# Patient Record
Sex: Male | Born: 1980 | Marital: Single | Smoking: Never smoker
Health system: Southern US, Community
[De-identification: ages and names within clinical notes are randomized; demographics above are authoritative.]

## PROBLEM LIST (undated history)

## (undated) DIAGNOSIS — L409 Psoriasis, unspecified: Secondary | ICD-10-CM

## (undated) DIAGNOSIS — R011 Cardiac murmur, unspecified: Secondary | ICD-10-CM

## (undated) HISTORY — DX: Psoriasis, unspecified: L40.9

## (undated) HISTORY — DX: Cardiac murmur, unspecified: R01.1

---
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
## 2005-07-10 ENCOUNTER — Ambulatory Visit: Payer: Self-pay | Admitting: Internal Medicine

## 2005-07-17 ENCOUNTER — Ambulatory Visit: Payer: Self-pay | Admitting: Internal Medicine

## 2005-11-14 ENCOUNTER — Ambulatory Visit: Payer: Self-pay | Admitting: Internal Medicine

## 2007-09-29 ENCOUNTER — Ambulatory Visit: Payer: Self-pay | Admitting: Internal Medicine

## 2007-09-29 DIAGNOSIS — R0789 Other chest pain: Secondary | ICD-10-CM | POA: Insufficient documentation

## 2010-05-26 NOTE — Assessment & Plan Note (Signed)
[facility]                             PRIMARY CARE OFFICE NOTE   NAME:Robert Hutchinson, Robert Hutchinson                      MRN:          [id number]  DATE:07/17/2005                            DOB:          1980-11-12    This patient is a 30 year old Caucasian male who presents today to establish  with our practice.  He has enjoyed excellent health.  At approximate age  62, he was hospitalized for a febrile dermatitis-type syndrome.  He knows  very few details but was released after a few days.  He was hospitalized in  1998 for an appendectomy.  He has history of mild psoriasis, otherwise no  medical illnesses.   REVIEW OF SYSTEMS:  Negative.  He has had a history of a functional murmur  that has been evaluated in the past.   SOCIAL HISTORY:  He is married.  Went to school in [place] state.  Relocated from the [place] area.  He is married, no children.  Nonsmoker.   FAMILY HISTORY:  Father and mother both age 62.  Father apparently has  history of a benign brain tumor, probably a meningioma.  Paternal  grandmother with colon cancer.   EXAMINATION:  Revealed a healthy-appearing fit male in no acute distress.  Blood pressure was low normal.  SKIN:  Negative without any psoriatic  lesions at the present time.  FUNDI, EARS, NOSE AND THROAT:  Clear.  NECK:  No bruits or adenopathy.  CHEST:  Clear.  CARDIOVASCULAR EXAM:  Normal heart  sounds.  There is no audible murmur on today's exam.  ABDOMEN:  Soft,  nontender, no organomegaly.  EXTERNAL GENITALIA:  Normal.  EXTREMITIES:  Revealed no edema.  Peripheral pulses were full.   IMPRESSION:  Psoriasis.  Unremarkable clinical exam.   DISPOSITION:  He was given refills on his topical steroids.  Return here  p.r.n.                                   Gordy Savers, MD   PFK/MedQ  DD:  07/17/2005  DT:  07/17/2005  Job #:  [id number]

## 2010-07-17 ENCOUNTER — Ambulatory Visit (INDEPENDENT_AMBULATORY_CARE_PROVIDER_SITE_OTHER): Payer: BC Managed Care – PPO | Admitting: Internal Medicine

## 2010-07-17 DIAGNOSIS — Z23 Encounter for immunization: Secondary | ICD-10-CM

## 2010-07-17 DIAGNOSIS — Z0000 Encounter for general adult medical examination without abnormal findings: Secondary | ICD-10-CM

## 2010-07-17 MED ORDER — MEASLES, MUMPS & RUBELLA VAC ~~LOC~~ INJ: 0.5000 mL | INJECTION | Freq: Once | SUBCUTANEOUS | Status: DC

## 2010-07-24 ENCOUNTER — Ambulatory Visit (INDEPENDENT_AMBULATORY_CARE_PROVIDER_SITE_OTHER): Payer: BC Managed Care – PPO | Admitting: Internal Medicine

## 2010-07-24 DIAGNOSIS — Z0000 Encounter for general adult medical examination without abnormal findings: Secondary | ICD-10-CM

## 2010-07-24 DIAGNOSIS — Z23 Encounter for immunization: Secondary | ICD-10-CM

## 2011-01-03 ENCOUNTER — Encounter: Payer: Self-pay | Admitting: Family Medicine

## 2011-01-05 ENCOUNTER — Ambulatory Visit (INDEPENDENT_AMBULATORY_CARE_PROVIDER_SITE_OTHER): Payer: BC Managed Care – PPO | Admitting: Family Medicine

## 2011-01-05 ENCOUNTER — Encounter: Payer: Self-pay | Admitting: Family Medicine

## 2011-01-05 VITALS — BP 130/80 | HR 60 | Temp 98.7°F | Wt 227.0 lb

## 2011-01-05 DIAGNOSIS — L408 Other psoriasis: Secondary | ICD-10-CM

## 2011-01-05 DIAGNOSIS — L409 Psoriasis, unspecified: Secondary | ICD-10-CM

## 2011-01-05 DIAGNOSIS — Z23 Encounter for immunization: Secondary | ICD-10-CM

## 2011-01-05 NOTE — Progress Notes (Signed)
Addended by: Luisa Dago on: 01/05/2011 09:16 AM   Modules accepted: Orders

## 2011-01-05 NOTE — Progress Notes (Signed)
Office Note 01/05/2011  CC:  Chief Complaint  Patient presents with  . Establish Care    transfer from [place]    HPI:  Robert Hutchinson is a 30 y.o. White male who is here to establish care---he lives closer to [place] than [place] (used to see Dr. Kirtland Bouchard). Patient's most recent primary MD: Dr Kirtland Bouchard at [facility]. Old records were reviewed prior to or during today's visit.  He has no complaints, just wants to get established with out office.  Last CPE was about 3 yrs ago.  He does not want to get his CPE today.  Past Medical History  Diagnosis Date  . Psoriasis Dxd age 69    Sees derm in [place]: on enbrel since 2010 or so--helping well.  Marland Kitchen Heart murmur     functional    Past Surgical History  Procedure Date  . Appendectomy 1998    Family History  Problem Relation Age of Onset  . Other Father     Meningioma  . Cancer Maternal Grandmother     colon  . Cancer Mother     kidney cancer about age 52    History   Social History  . Marital Status: Married    Spouse Name: Belenda Cruise    Number of Children: N/A  . Years of Education: N/A   Occupational History  . Teacher    Social History Main Topics  . Smoking status: Never Smoker   . Smokeless tobacco: Never Used  . Alcohol Use: Yes     twice a week  . Drug Use: No  . Sexually Active: Not on file   Other Topics Concern  . Not on file   Social History Narrative   Married, moved to [place] in 2005.Has one son 2 and 1/2 y/o.Teacher at [employer].  Went to [employer].No Tob, no signif alc, no drugs.Exercise: cardio and lt wt 3-5 times per week.    Outpatient Encounter Prescriptions as of 01/05/2011  Medication Sig Dispense Refill  . etanercept (ENBREL) 50 MG/ML injection Inject 50 mg into the skin once a week.        . Multiple Vitamin (MULTIVITAMIN) tablet Take 1 tablet by mouth daily.          No Known Allergies  ROS Review of Systems  Constitutional: Negative for fever, chills, appetite  change and fatigue.  HENT: Negative for ear pain, congestion, sore throat, neck stiffness and dental problem.   Eyes: Negative for discharge, redness and visual disturbance.  Respiratory: Negative for cough, chest tightness, shortness of breath and wheezing.   Cardiovascular: Negative for chest pain, palpitations and leg swelling.  Gastrointestinal: Negative for nausea, vomiting, abdominal pain, diarrhea and blood in stool.  Genitourinary: Negative for dysuria, urgency, frequency, hematuria, flank pain and difficulty urinating.  Musculoskeletal: Negative for myalgias, back pain, joint swelling and arthralgias.  Skin: Negative for pallor and rash.  Neurological: Negative for dizziness, speech difficulty, weakness and headaches.  Hematological: Negative for adenopathy. Does not bruise/bleed easily.  Psychiatric/Behavioral: Negative for confusion and sleep disturbance. The patient is not nervous/anxious.     PE; Blood pressure 130/80, pulse 60, temperature 98.7 F (37.1 C), temperature source Temporal, weight 227 lb (102.967 kg), SpO2 99.00%. Gen: Alert, well appearing.  Patient is oriented to person, place, time, and situation. ENT: Ears: EACs clear, normal epithelium.  TMs with good light reflex and landmarks bilaterally.  Eyes: no injection, icteris, swelling, or exudate.  EOMI, PERRLA. Nose: no drainage or turbinate edema/swelling.  No  injection or focal lesion.  Mouth: lips without lesion/swelling.  Oral mucosa pink and moist.  Dentition intact and without obvious caries or gingival swelling.  Oropharynx without erythema, exudate, or swelling.  Neck - No masses or thyromegaly or limitation in range of motion CV: RRR, no m/r/g.   LUNGS: CTA bilat, nonlabored resps, good aeration in all lung fields. EXT: no clubbing, cyanosis, or edema.   Pertinent labs:  none  ASSESSMENT AND PLAN:   Psoriasis He is stable and doing well on Enbrel. He gets routine derm f/u/monitoring for this and will  continue this. He'll set up appt here for CPE sometime in the near future. Flu vaccine given IM today. He is due for 3rd and final TwinRix (HepA and HepB "fast track" immunization) but we don't have this vaccine today. We'll order it and he'll either return at nurse visit and get this or he'll get it at his scheduled CPE.     No Follow-up on file.

## 2011-01-05 NOTE — Assessment & Plan Note (Signed)
He is stable and doing well on Enbrel. He gets routine derm f/u/monitoring for this and will continue this. He'll set up appt here for CPE sometime in the near future. Flu vaccine given IM today. He is due for 3rd and final TwinRix (HepA and HepB "fast track" immunization) but we don't have this vaccine today. We'll order it and he'll either return at nurse visit and get this or he'll get it at his scheduled CPE.

## 2011-01-13 ENCOUNTER — Ambulatory Visit (INDEPENDENT_AMBULATORY_CARE_PROVIDER_SITE_OTHER): Payer: BC Managed Care – PPO | Admitting: Family Medicine

## 2011-01-13 ENCOUNTER — Encounter: Payer: Self-pay | Admitting: Family Medicine

## 2011-01-13 VITALS — BP 136/86 | HR 62 | Temp 98.0°F | Ht 73.0 in | Wt 230.0 lb

## 2011-01-13 DIAGNOSIS — J329 Chronic sinusitis, unspecified: Secondary | ICD-10-CM

## 2011-01-13 MED ORDER — AMOXICILLIN-POT CLAVULANATE 875-125 MG PO TABS: 1.0000 | ORAL_TABLET | Freq: Two times a day (BID) | ORAL | Status: AC

## 2011-01-13 NOTE — Patient Instructions (Signed)
Sinusitis Sinuses are air pockets within the bones of your face. The growth of bacteria within a sinus leads to infection. The infection prevents the sinuses from draining. This infection is called sinusitis. SYMPTOMS  There will be different areas of pain depending on which sinuses have become infected.  The maxillary sinuses often produce pain beneath the eyes.   Frontal sinusitis may cause pain in the middle of the forehead and above the eyes.  Other problems (symptoms) include:  Toothaches.   Colored, pus-like (purulent) drainage from the nose.   Swelling, warmth, and tenderness over the sinus areas may be signs of infection.  TREATMENT  Sinusitis is most often determined by an exam.X-rays may be taken. If x-rays have been taken, make sure you obtain your results or find out how you are to obtain them. Your caregiver may give you medications (antibiotics). These are medications that will help kill the bacteria causing the infection. You may also be given a medication (decongestant) that helps to reduce sinus swelling.  HOME CARE INSTRUCTIONS   Only take over-the-counter or prescription medicines for pain, discomfort, or fever as directed by your caregiver.   Drink extra fluids. Fluids help thin the mucus so your sinuses can drain more easily.   Applying either moist heat or ice packs to the sinus areas may help relieve discomfort.   Use saline nasal sprays to help moisten your sinuses. The sprays can be found at your local drugstore.  SEEK IMMEDIATE MEDICAL CARE IF:  You have a fever.   You have increasing pain, severe headaches, or toothache.   You have nausea, vomiting, or drowsiness.   You develop unusual swelling around the face or trouble seeing.  MAKE SURE YOU:   Understand these instructions.   Will watch your condition.   Will get help right away if you are not doing well or get worse.  Document Released: 12/25/2004 Document Revised: 09/06/2010 Document Reviewed:  07/24/2006 [facility] Patient Information 2012 [place], [place].  Hold Enbrel until acute infection cleared.

## 2011-01-13 NOTE — Progress Notes (Signed)
  Subjective:    Patient ID: Robert Hutchinson, male    DOB: 04-Nov-1980, 31 y.o.   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 Onset 2 days ago of nasal congestion and postnasal drip. Took over-the-counter decongestant without much improvement. Denies any cough, fever, or earache. Some left maxillary sinus congestion. Takes Enbrel for psoriasis. Some yellow tinged nasal mucous left naris.  Minimal headache. No stiff neck. Influenza vaccine last week   Review of Systems  Constitutional: Positive for fatigue. Negative for fever and chills.  HENT: Positive for congestion and sinus pressure. Negative for sore throat and voice change.        Objective:   Physical Exam  Constitutional: He appears well-developed and well-nourished. No distress.  HENT:  Right Ear: External ear normal.  Left Ear: External ear normal.  Mouth/Throat: Oropharynx is clear and moist.  Neck: Neck supple.  Cardiovascular: Normal rate and regular rhythm.   Pulmonary/Chest: Effort normal and breath sounds normal. No respiratory distress. He has no wheezes. He has no rales.  Lymphadenopathy:    He has no cervical adenopathy.          Assessment & Plan:  Acute sinusitis. More likely viral. We recommended observation for now. If symptoms persist with localized facial pain in patient on Enbrel start Augmentin. Hold Enbrel for the next week

## 2011-09-19 ENCOUNTER — Telehealth: Payer: Self-pay | Admitting: Family Medicine

## 2011-09-19 ENCOUNTER — Encounter: Payer: Self-pay | Admitting: Family Medicine

## 2011-09-19 ENCOUNTER — Ambulatory Visit (INDEPENDENT_AMBULATORY_CARE_PROVIDER_SITE_OTHER): Payer: BC Managed Care – PPO | Admitting: Family Medicine

## 2011-09-19 VITALS — BP 123/81 | HR 75 | Temp 97.4°F | Wt 226.0 lb

## 2011-09-19 DIAGNOSIS — J019 Acute sinusitis, unspecified: Secondary | ICD-10-CM

## 2011-09-19 DIAGNOSIS — Z23 Encounter for immunization: Secondary | ICD-10-CM

## 2011-09-19 MED ORDER — AMOXICILLIN 875 MG PO TABS: 875.0000 mg | ORAL_TABLET | Freq: Two times a day (BID) | ORAL | Status: AC

## 2011-09-19 NOTE — Progress Notes (Signed)
OFFICE NOTE  09/19/2011  CC:  Chief Complaint  Patient presents with  . Sinusitis    sinus pressure/congestion, scratchy throat x 5 days     HPI: Patient is a 31 y.o. Caucasian male who is here for some respiratory complaints. Pt presents complaining of respiratory symptoms for 6  days.  Primary symptoms are: ST, sinus pressure mainly on left side, headache (generalized).  Worst symptoms seems to be the sinus pressure.  Lately the symptoms seem to be worsening. Pertinent negatives: No fevers, no wheezing, and no SOB.  No pain in face or teeth.  ST mild at most.   Symptoms made worse by talking, towards end of day.  Symptoms improved by nothing. Smoker? no Recent sick contact? no Muscle or joint aches? Yes, but from recent restart of workouts  Additional ROS: no n/v/d or abdominal pain.  No rash.  No neck stiffness.   +Mild fatigue.  +Mild appetite loss.   Pertinent PMH:  Past Medical History  Diagnosis Date  . Psoriasis Dxd age 50    Sees derm in [place]: on enbrel since 2010 or so--helping well.  Marland Kitchen Heart murmur     functional    MEDS:  Outpatient Prescriptions Prior to Visit  Medication Sig Dispense Refill  . etanercept (ENBREL) 50 MG/ML injection Inject 50 mg into the skin once a week.        . Multiple Vitamin (MULTIVITAMIN) tablet Take 1 tablet by mouth daily.          PE: Blood pressure 123/81, pulse 75, temperature 97.4 F (36.3 C), temperature source Temporal, weight 226 lb (102.513 kg), SpO2 98.00%. VS: noted--normal. Gen: alert, NAD, NONTOXIC APPEARING. HEENT: eyes without injection, drainage, or swelling.  Ears: EACs clear, TMs with normal light reflex and landmarks.  Nose: Clear rhinorrhea, with some dried, crusty exudate adherent to mildly injected mucosa.  No purulent d/c.  Left sided paranasal sinus TTP.  No facial swelling.  Throat and mouth without focal lesion.  No pharyngial swelling, erythema, or exudate.   Neck: supple, no LAD.   LUNGS: CTA  bilat, nonlabored resps.   CV: RRR, no m/r/g. EXT: no c/c/e SKIN: no rash    IMPRESSION AND PLAN:  Acute sinusitis. Amoxil 875mg  bid x 10d. Neti pot.  OTC nonsedating antihistamine prn. Flu vaccine today.  FOLLOW UP: prn

## 2011-09-19 NOTE — Telephone Encounter (Signed)
Caller: [name]/Patient; Patient Name: Robert Hutchinson; PCP: Earley Favor [facility]); Best Callback Phone Number: [phone].  Pt. c/o Sore Throat, and Sinus Pressure/pain, onset 2 days ago 09/27/11.  Pt. requesting appt.  Triaged with Face Pain or Swelling:  Disposition:  See Provider within 4 hours for:  Localized tenderness over one or both temples.  Appointment scheduled for 09:00 today with Dr. Milinda Cave.  Care instructions deferred to Dr. Milinda Cave.  CAN/db

## 2013-08-29 ENCOUNTER — Emergency Department (INDEPENDENT_AMBULATORY_CARE_PROVIDER_SITE_OTHER): Payer: BC Managed Care – PPO

## 2013-08-29 ENCOUNTER — Encounter: Payer: Self-pay | Admitting: Emergency Medicine

## 2013-08-29 ENCOUNTER — Emergency Department
Admission: EM | Admit: 2013-08-29 | Discharge: 2013-08-29 | Disposition: A | Discharge status: Home / Self Care | Payer: BC Managed Care – PPO | Source: Home / Self Care | Attending: Emergency Medicine | Admitting: Emergency Medicine

## 2013-08-29 DIAGNOSIS — X58XXXA Exposure to other specified factors, initial encounter: Secondary | ICD-10-CM

## 2013-08-29 DIAGNOSIS — S92919A Unspecified fracture of unspecified toe(s), initial encounter for closed fracture: Secondary | ICD-10-CM

## 2013-08-29 DIAGNOSIS — S92501A Displaced unspecified fracture of right lesser toe(s), initial encounter for closed fracture: Secondary | ICD-10-CM

## 2013-08-29 MED ORDER — IBUPROFEN 200 MG PO TABS: ORAL_TABLET | ORAL | Status: DC

## 2013-08-29 NOTE — ED Provider Notes (Signed)
CSN: [id number]     Arrival date & time 08/29/13  1257 History   First MD Initiated Contact with Patient 08/29/13 1314     Chief Complaint  Patient presents with  . Toe Pain   (Consider location/radiation/quality/duration/timing/severity/associated sxs/prior Treatment) HPI Yesterday, accidentally hit right lateral forefoot on the door jam. Now, severe pain 7/10 that is sharp, slight tingling especially right fifth toe. He can weight-bear, but with pain.  Past Medical History  Diagnosis Date  . Psoriasis Dxd age 71    Sees derm in [place]: on enbrel since 2010 or so--helping well.  Marland Kitchen Heart murmur     functional   Past Surgical History  Procedure Laterality Date  . Appendectomy  1998   Family History  Problem Relation Age of Onset  . Other Father     Meningioma  . Cancer Maternal Grandmother     colon  . Cancer Mother     kidney cancer about age 3  .      History  Substance Use Topics  . Smoking status: Never Smoker   . Smokeless tobacco: Never Used  . Alcohol Use: Yes     Comment: twice a week    Review of Systems  All other systems reviewed and are negative.   Allergies  Review of patient's allergies indicates no known allergies.  Home Medications   Prior to Admission medications   Medication Sig Start Date End Date Taking? Authorizing Provider  etanercept (ENBREL) 50 MG/ML injection Inject 50 mg into the skin once a week.      Historical Provider, MD  ibuprofen (ADVIL,MOTRIN) 200 MG tablet Take three tablets ( 600 milligrams total) every 6 with food as needed for pain. 08/29/13   Lajean Manes, MD  Multiple Vitamin (MULTIVITAMIN) tablet Take 1 tablet by mouth daily.      Historical Provider, MD   BP 134/78  Pulse 69  Temp(Src) 97.8 F (36.6 C) (Oral)  Resp 16  Ht 6\' 1"  (1.854 m)  Wt 230 lb (104.327 kg)  BMI 30.35 kg/m2  SpO2 98% Physical Exam  Nursing note and vitals reviewed. Constitutional: He is oriented to person, place, and time. He appears  well-developed and well-nourished. No distress.  Very uncomfortable from right anterior lateral foot pain. He walks with a limp favoring this part of his foot  HENT:  Head: Normocephalic and atraumatic.  Eyes: Conjunctivae and EOM are normal. Pupils are equal, round, and reactive to light. No scleral icterus.  Neck: Normal range of motion.  Cardiovascular: Normal rate.   Pulmonary/Chest: Effort normal.  Abdominal: He exhibits no distension.  Musculoskeletal:       Right ankle: Normal.       Right foot: He exhibits decreased range of motion, tenderness, bony tenderness and swelling. He exhibits normal capillary refill.       Feet:  Neurological: He is alert and oriented to person, place, and time.  Skin: Skin is warm.  Psychiatric: He has a normal mood and affect.    ED Course  Procedures (including critical care time) Labs Review Labs Reviewed - No data to display  Imaging Review Dg Foot Complete Right  08/29/2013   CLINICAL DATA:  Pain involving the fourth and fifth toes  EXAM: RIGHT FOOT COMPLETE - 3+ VIEW  COMPARISON:  None.  FINDINGS: There is an oblique fracture involving the fifth proximal phalanx. The fracture fragments appear nondisplaced. There is no additional fractures or subluxations. No radiopaque foreign bodies are soft tissue calcifications.  IMPRESSION: Acute fracture involves the fifth proximal phalanx.   Electronically Signed   By: Signa Kellaylor  Stroud M.D.   On: 08/29/2013 13:57     MDM   1. Fracture of fifth toe, right, closed, initial encounter    acute oblique, nondisplaced fracture fifth proximal phalanx  Treatment options discussed, as well as risks, benefits, alternatives. Patient voiced understanding and agreement with the following plans:  Buddy tape fourth and fifth toes Postop shoe Ibuprofen or Tylenol OTC discussed. He declined any prescription pain med Encourage rest, ice, compression with ACE bandage, and elevation of injured body part. Follow-up  with ortho in 5-7 days if not improving, or sooner if symptoms become worse. Precautions discussed. Anticipatory guidance discussed Red flags discussed. Questions invited and answered. Patient voiced understanding and agreement.   Lajean Manesavid Massey, MD 08/29/13 Ebony [id number]

## 2013-08-29 NOTE — ED Notes (Signed)
Reports injuring right small toe yesterday; used ice; now bruised and somewhat edematous. Desires radiology evaluation.

## 2014-03-10 ENCOUNTER — Ambulatory Visit (INDEPENDENT_AMBULATORY_CARE_PROVIDER_SITE_OTHER): Payer: BC Managed Care – PPO | Admitting: Family Medicine

## 2014-03-10 ENCOUNTER — Encounter: Payer: Self-pay | Admitting: Family Medicine

## 2014-03-10 VITALS — BP 110/60 | HR 92 | Temp 98.7°F | Ht 74.0 in | Wt 230.0 lb

## 2014-03-10 DIAGNOSIS — R509 Fever, unspecified: Secondary | ICD-10-CM

## 2014-03-10 DIAGNOSIS — R5383 Other fatigue: Secondary | ICD-10-CM

## 2014-03-10 DIAGNOSIS — L409 Psoriasis, unspecified: Secondary | ICD-10-CM

## 2014-03-10 DIAGNOSIS — R7401 Elevation of levels of liver transaminase levels: Secondary | ICD-10-CM

## 2014-03-10 DIAGNOSIS — R52 Pain, unspecified: Secondary | ICD-10-CM

## 2014-03-10 DIAGNOSIS — R740 Nonspecific elevation of levels of transaminase and lactic acid dehydrogenase [LDH]: Secondary | ICD-10-CM

## 2014-03-10 LAB — CBC WITH DIFFERENTIAL/PLATELET
BASOS ABS: 0 10*3/uL (ref 0.0–0.1)
Basophils Relative: 0.4 % (ref 0.0–3.0)
Eosinophils Absolute: 0 10*3/uL (ref 0.0–0.7)
Eosinophils Relative: 0 % (ref 0.0–5.0)
HCT: 42.4 % (ref 39.0–52.0)
HEMOGLOBIN: 14.6 g/dL (ref 13.0–17.0)
LYMPHS PCT: 42 % (ref 12.0–46.0)
Lymphs Abs: 3.6 10*3/uL (ref 0.7–4.0)
MCHC: 34.4 g/dL (ref 30.0–36.0)
MCV: 83 fl (ref 78.0–100.0)
Monocytes Absolute: 1 10*3/uL (ref 0.1–1.0)
Monocytes Relative: 11.7 % (ref 3.0–12.0)
Neutro Abs: 3.9 10*3/uL (ref 1.4–7.7)
Neutrophils Relative %: 45.9 % (ref 43.0–77.0)
PLATELETS: 145 10*3/uL — AB (ref 150.0–400.0)
RBC: 5.11 Mil/uL (ref 4.22–5.81)
RDW: 13.1 % (ref 11.5–15.5)
WBC: 8.6 10*3/uL (ref 4.0–10.5)

## 2014-03-10 LAB — COMPREHENSIVE METABOLIC PANEL
ALBUMIN: 4.5 g/dL (ref 3.5–5.2)
ALT: 71 U/L — AB (ref 0–53)
AST: 24 U/L (ref 0–37)
Alkaline Phosphatase: 76 U/L (ref 39–117)
BUN: 8 mg/dL (ref 6–23)
CO2: 29 meq/L (ref 19–32)
Calcium: 9.3 mg/dL (ref 8.4–10.5)
Chloride: 100 mEq/L (ref 96–112)
Creatinine, Ser: 1.06 mg/dL (ref 0.40–1.50)
GFR: 85.1 mL/min (ref >60.00)
Glucose, Bld: 88 mg/dL (ref 70–99)
Potassium: 4.2 mEq/L (ref 3.5–5.1)
Sodium: 136 mEq/L (ref 135–145)
TOTAL PROTEIN: 7.5 g/dL (ref 6.0–8.3)
Total Bilirubin: 0.7 mg/dL (ref 0.2–1.2)

## 2014-03-10 LAB — TSH: TSH: 2.94 u[IU]/mL (ref 0.35–4.50)

## 2014-03-10 LAB — C-REACTIVE PROTEIN: CRP: 3.9 mg/dL (ref 0.5–20.0)

## 2014-03-10 LAB — SEDIMENTATION RATE: SED RATE: 28 mm/h — AB (ref 0–22)

## 2014-03-10 NOTE — Progress Notes (Signed)
Pre visit review using our clinic review tool, if applicable. No additional management support is needed unless otherwise documented below in the visit note. 

## 2014-03-10 NOTE — Progress Notes (Signed)
OFFICE NOTE  03/10/2014  CC:  Chief Complaint  Patient presents with  . Fatigue   HPI: Patient is a 34 y.o. Caucasian male who is here for onset of body aches and fatigue, subjective fevers (cold sweats/nighttime rigors). 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  Lasted 4d, no resp sx's.  Then he felt fine. Then had softball practice again a few days later, and afterwards the pattern of sx's repeated itself.  Also, feels some ST and nasal congestion starting today.  No cough.  He is in good physical shape and exercises regularly and never has post-exercise soreness like this.  ROS: Review of Systems  Constitutional: Negative for unexpected weight change.  HENT: Negative for mouth sores and sinus pressure.   Eyes: Negative for redness and visual disturbance.  Respiratory: Negative for cough and shortness of breath.   Cardiovascular: Negative for chest pain.  Gastrointestinal: Negative for nausea, abdominal pain and blood in stool.  Genitourinary: Negative for dysuria.  Musculoskeletal: Negative for back pain and joint swelling.  Skin: Negative for rash.  Neurological: Negative for weakness and headaches.  Hematological: Negative for adenopathy. Does not bruise/bleed easily.  Psychiatric/Behavioral: Negative for dysphoric mood. The patient is not nervous/anxious.     Pertinent PMH:  Past medical, surgical, social, and family history reviewed and no changes are noted since last office visit.  MEDS:  Hx of being on enbrel but not for about 2 yrs. Most recently on otezla (DMARD/PDE4 inhibitor) for psoriasis.  PE: Blood pressure 110/60, pulse 92, temperature 98.7 F (37.1 C), temperature source Oral, height _0  (1.88 m), weight 230 lb (104.327 kg), SpO2 96 %. Gen: Alert, tired-appearing but in NAD.  Patient is oriented to person, place, time, and situation. NOT:RRNH: no injection, icteris, swelling, or exudate.  EOMI, PERRLA. Mouth: lips without lesion/swelling.  Oral mucosa pink and  moist. Oropharynx without erythema, exudate, or swelling.  Neck - No masses or thyromegaly or limitation in range of motion CV: RRR, no m/r/g.   LUNGS: CTA bilat, nonlabored resps, good aeration in all lung fields. ABD: soft, NT, ND, BS normal.  No hepatospenomegaly or mass.  No bruits. EXT: no clubbing, cyanosis, or edema.  No palpable LAD in neck, axillae, or groin. Skin - no sores or suspicious lesions or rashes or color changes Musculoskeletal: no joint swelling, erythema, warmth, or tenderness.  ROM of all joints intact.   IMPRESSION AND PLAN:  Episodic fatigue/body aches/subjective fevers: unclear etiology.   He appears tired but otherwise well today.   Will do general lab w/u: TSH, CBC w/diff, ANA, ESR, CRP, CMET, Rh factor, parvo titers. Call if pattern of symptoms recurs, would like to examine him when in the midst of this. Signs/symptoms to call or return for were reviewed and pt expressed understanding.  Pt declined flu vaccine today.  An After Visit Summary was printed and given to the patient.  FOLLOW UP: prn

## 2014-03-11 ENCOUNTER — Other Ambulatory Visit: Payer: BC Managed Care – PPO

## 2014-03-11 DIAGNOSIS — R7401 Elevation of levels of liver transaminase levels: Secondary | ICD-10-CM

## 2014-03-11 DIAGNOSIS — R740 Nonspecific elevation of levels of transaminase and lactic acid dehydrogenase [LDH]: Principal | ICD-10-CM

## 2014-03-11 LAB — RHEUMATOID FACTOR

## 2014-03-11 LAB — HEPATITIS C ANTIBODY: HCV Ab: NEGATIVE

## 2014-03-11 LAB — HIV ANTIBODY (ROUTINE TESTING W REFLEX): HIV: NONREACTIVE

## 2014-03-11 LAB — HEPATITIS B SURFACE ANTIGEN: HEP B S AG: NEGATIVE

## 2014-03-11 LAB — ANA: Anti Nuclear Antibody(ANA): NEGATIVE

## 2014-03-12 LAB — PARVOVIRUS B19 ANTIBODY, IGG AND IGM
PAROVIRUS B19 IGG ABS: 3.9 {index} — AB (ref <0.9)
Parovirus B19 IgM Abs: 0.3 index (ref <0.9)

## 2014-03-12 NOTE — Progress Notes (Signed)
Quick Note:  Please notify patient that all but one of his lab tests have returned and there is only one abnormality. One of his liver tests was just slightly high. This is very nonspecific, and can happen with many types of transient viral infections, Or even just from having some fever recently. I did check his viral hepatitis antibodies and there is no sign that he has any viral hepatitis that would cause this lab to be abnormal. The only test I am waiting for is the result of an antibody level to a certain virus that sometimes causes this kind of illness. My suspicion of him having it is pretty low, however, so tell him not to worry. Please get an update on how he feels. Thanks. ______

## 2014-05-19 ENCOUNTER — Encounter: Payer: Self-pay | Admitting: Family Medicine

## 2014-05-19 ENCOUNTER — Ambulatory Visit (INDEPENDENT_AMBULATORY_CARE_PROVIDER_SITE_OTHER): Payer: BC Managed Care – PPO | Admitting: Family Medicine

## 2014-05-19 VITALS — BP 122/79 | HR 74 | Temp 97.9°F | Resp 16 | Wt 230.0 lb

## 2014-05-19 DIAGNOSIS — H1012 Acute atopic conjunctivitis, left eye: Secondary | ICD-10-CM | POA: Diagnosis not present

## 2014-05-19 DIAGNOSIS — Z7189 Other specified counseling: Secondary | ICD-10-CM | POA: Diagnosis not present

## 2014-05-19 DIAGNOSIS — Z7184 Encounter for health counseling related to travel: Secondary | ICD-10-CM

## 2014-05-19 MED ORDER — TYPHOID VACCINE PO CPDR: DELAYED_RELEASE_CAPSULE | ORAL | Status: DC

## 2014-05-19 NOTE — Progress Notes (Signed)
OFFICE NOTE  05/19/2014  CC:  Chief Complaint  Patient presents with  . Itchy Eye    x 1 day and red   HPI: Patient is a 34 y.o. Caucasian male who is here for red/itchy left eye. Onset yesterday, clear drainage, itching, and redness.  His vision is a bit "foggy". Minimal itching today.  No pain.  No fevers or recent URI/sinusitis sx's.  No FB sensation. He put OTC allergy drops in it and this has helped some: most recent use was 6 hrs ago or so. R eye w/out any symptoms. Children/wife at home w/out any similar sx's.  He also inquires about what vaccines/meds he may need to have in prep for a trip to [place] in a couple of months, [place].    Pertinent PMH:  Past medical, surgical, social, and family history reviewed and changes are noted since last office visit.  MEDS:  Outpatient Prescriptions Prior to Visit  Medication Sig Dispense Refill  . Apremilast (OTEZLA PO) Take by mouth daily.    Marland Kitchen. ibuprofen (ADVIL,MOTRIN) 200 MG tablet Take three tablets ( 600 milligrams total) every 6 with food as needed for pain. 30 tablet 0  . Multiple Vitamin (MULTIVITAMIN) tablet Take 1 tablet by mouth daily.       No facility-administered medications prior to visit.    PE: Blood pressure 122/79, pulse 74, temperature 97.9 F (36.6 C), temperature source Oral, resp. rate 16, weight 230 lb (104.327 kg), SpO2 98 %. Gen: Alert, well appearing.  Patient is oriented to person, place, time, and situation. Left eye with minimal bulbar and palpebral conjunctival injection, no active exudate but a bit of crusty exudate visible adherent to base of lashes of left eye.  No palpebral swelling, no purulent exudate, no periorbital swelling or erythema of skin around eye. PERRLA, EOMI.  Nose clear, oropharynx with no erythema, lesion, swelling, PND, or exudate. Neck - No masses or thyromegaly or limitation in range of motion    IMPRESSION AND PLAN:  1) Left eye allergic  vs viral conjunctivitis; recommended continuing OTC allergy eye drops prn. Told pt this could spread to the other eye and this would not necessarily change management. Signs/symptoms to call or return for were reviewed and pt expressed understanding.  2) Travel advice: Pt does need typhoid (oral) vaccine for his [place] trip, so I sent this rx to his pharmacy.   He does not need malaria prophylaxis, nor does he need rabies vaccine or yellow fever vaccine.  An After Visit Summary was printed and given to the patient.  FOLLOW UP: prn

## 2014-05-19 NOTE — Progress Notes (Signed)
Pre visit review using our clinic review tool, if applicable. No additional management support is needed unless otherwise documented below in the visit note. 

## 2014-05-19 NOTE — Patient Instructions (Signed)
Continue over the counter antihistamine (allergy) eye drops. (Generic for [employer]).

## 2014-05-21 ENCOUNTER — Telehealth: Payer: Self-pay | Admitting: *Deleted

## 2014-05-21 NOTE — Telephone Encounter (Signed)
Agree/noted. 

## 2014-05-21 NOTE — Telephone Encounter (Signed)
Per Dr. Milinda CaveMcGowen pt should continue using otc antihistamine drops. He stated that he advised pt during office visit that this may spread to his other eye but to continue otc drops. Pt advised and voiced understanding.

## 2014-05-21 NOTE — Telephone Encounter (Signed)
Pt called and stated that he seen you on 05/19/14 and would like some eye drops called into [facility]. He states that whatever was going on with his eye has now moved to the other eye.

## 2014-05-25 ENCOUNTER — Telehealth: Payer: Self-pay | Admitting: *Deleted

## 2014-05-25 NOTE — Telephone Encounter (Signed)
Pt LMOM stating that both of his eyes are still irritated and have had discharge all weekend. He wanted to know if he should come back in for ov or can Dr. Milinda CaveMcGowen call something in.

## 2014-05-26 MED ORDER — POLYMYXIN B-TRIMETHOPRIM 10000-0.1 UNIT/ML-% OP SOLN: 2.0000 [drp] | OPHTHALMIC | Status: DC

## 2014-05-26 NOTE — Telephone Encounter (Signed)
I sent in polytrim antibiotic drops to his pharmacy just now. Pls let him know.-thx

## 2014-05-26 NOTE — Telephone Encounter (Signed)
Left message on cell vm advising pt eye drops were sent in.

## 2014-09-12 IMAGING — CR DG FOOT COMPLETE 3+V*R*
3 series · 3 of 3 positions shown · non-contrast
Comparison: None.

CLINICAL DATA: Pain involving the fourth and fifth toes

EXAM:
RIGHT FOOT COMPLETE - 3+ VIEW

[view not recorded (1 of 3)]
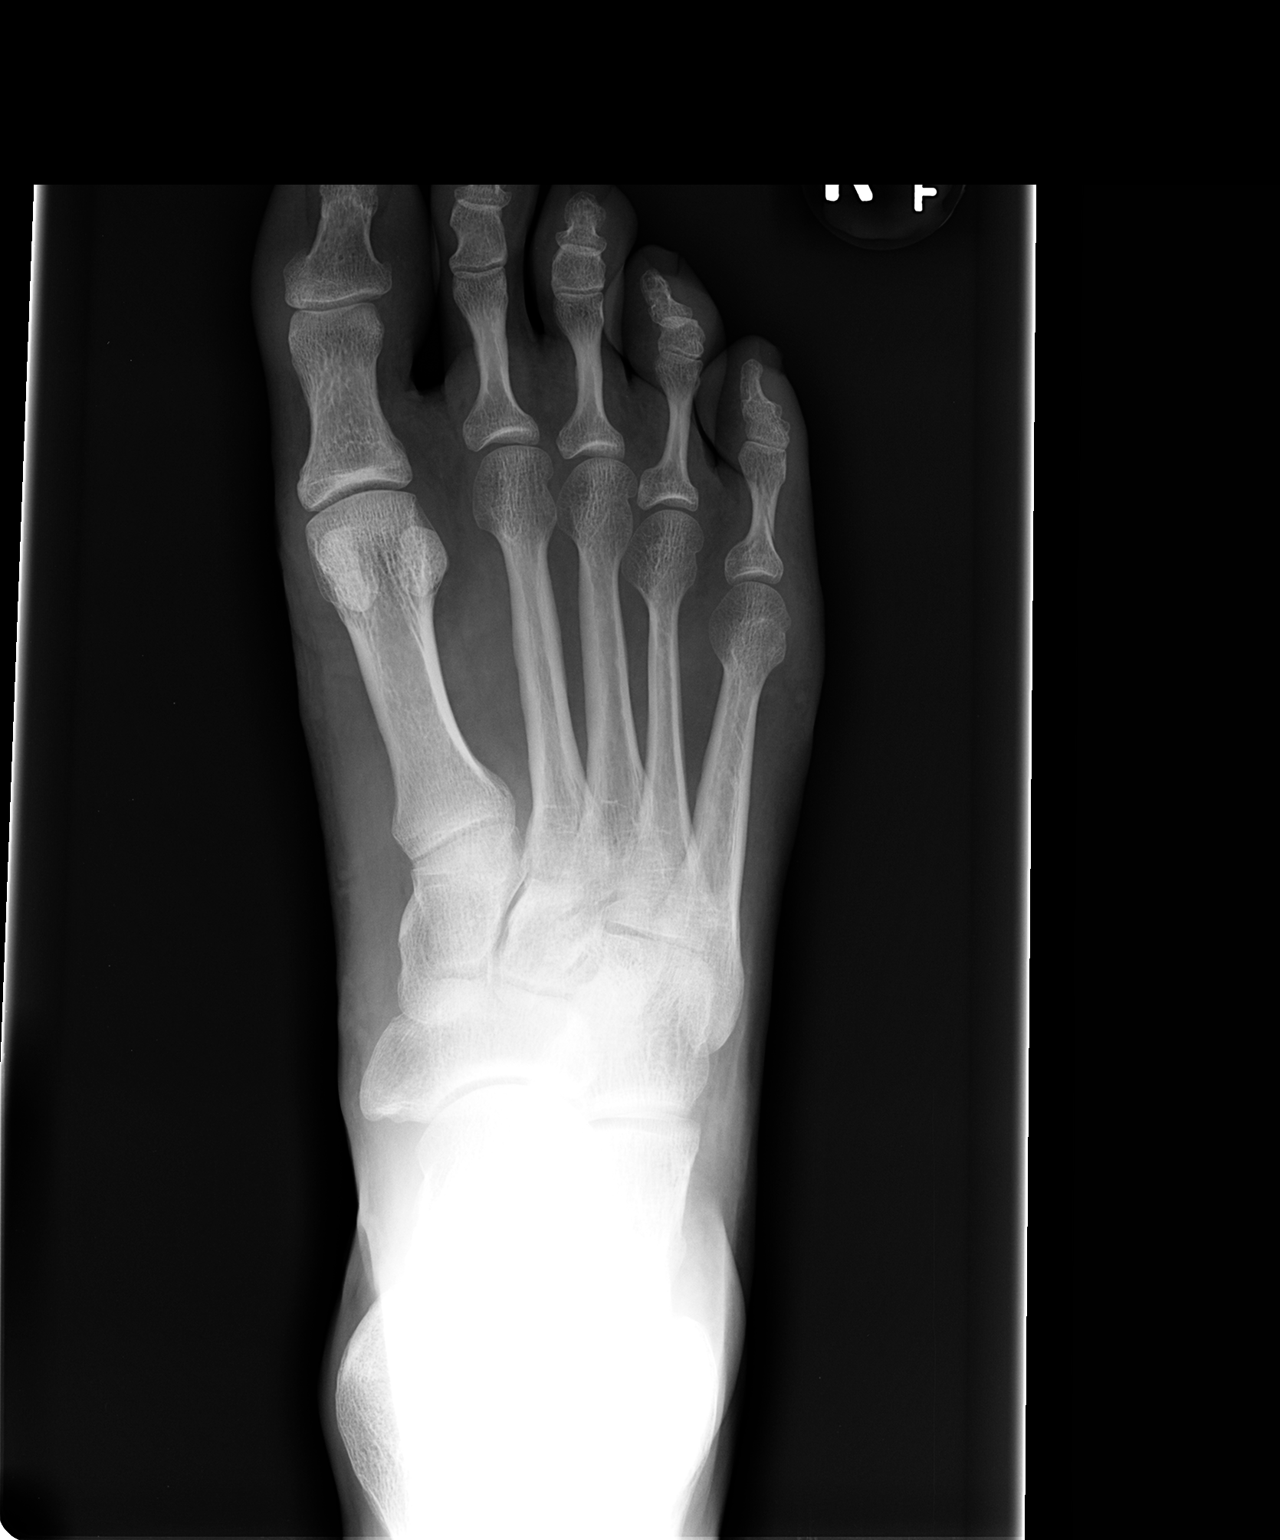

[view not recorded (2 of 3)]
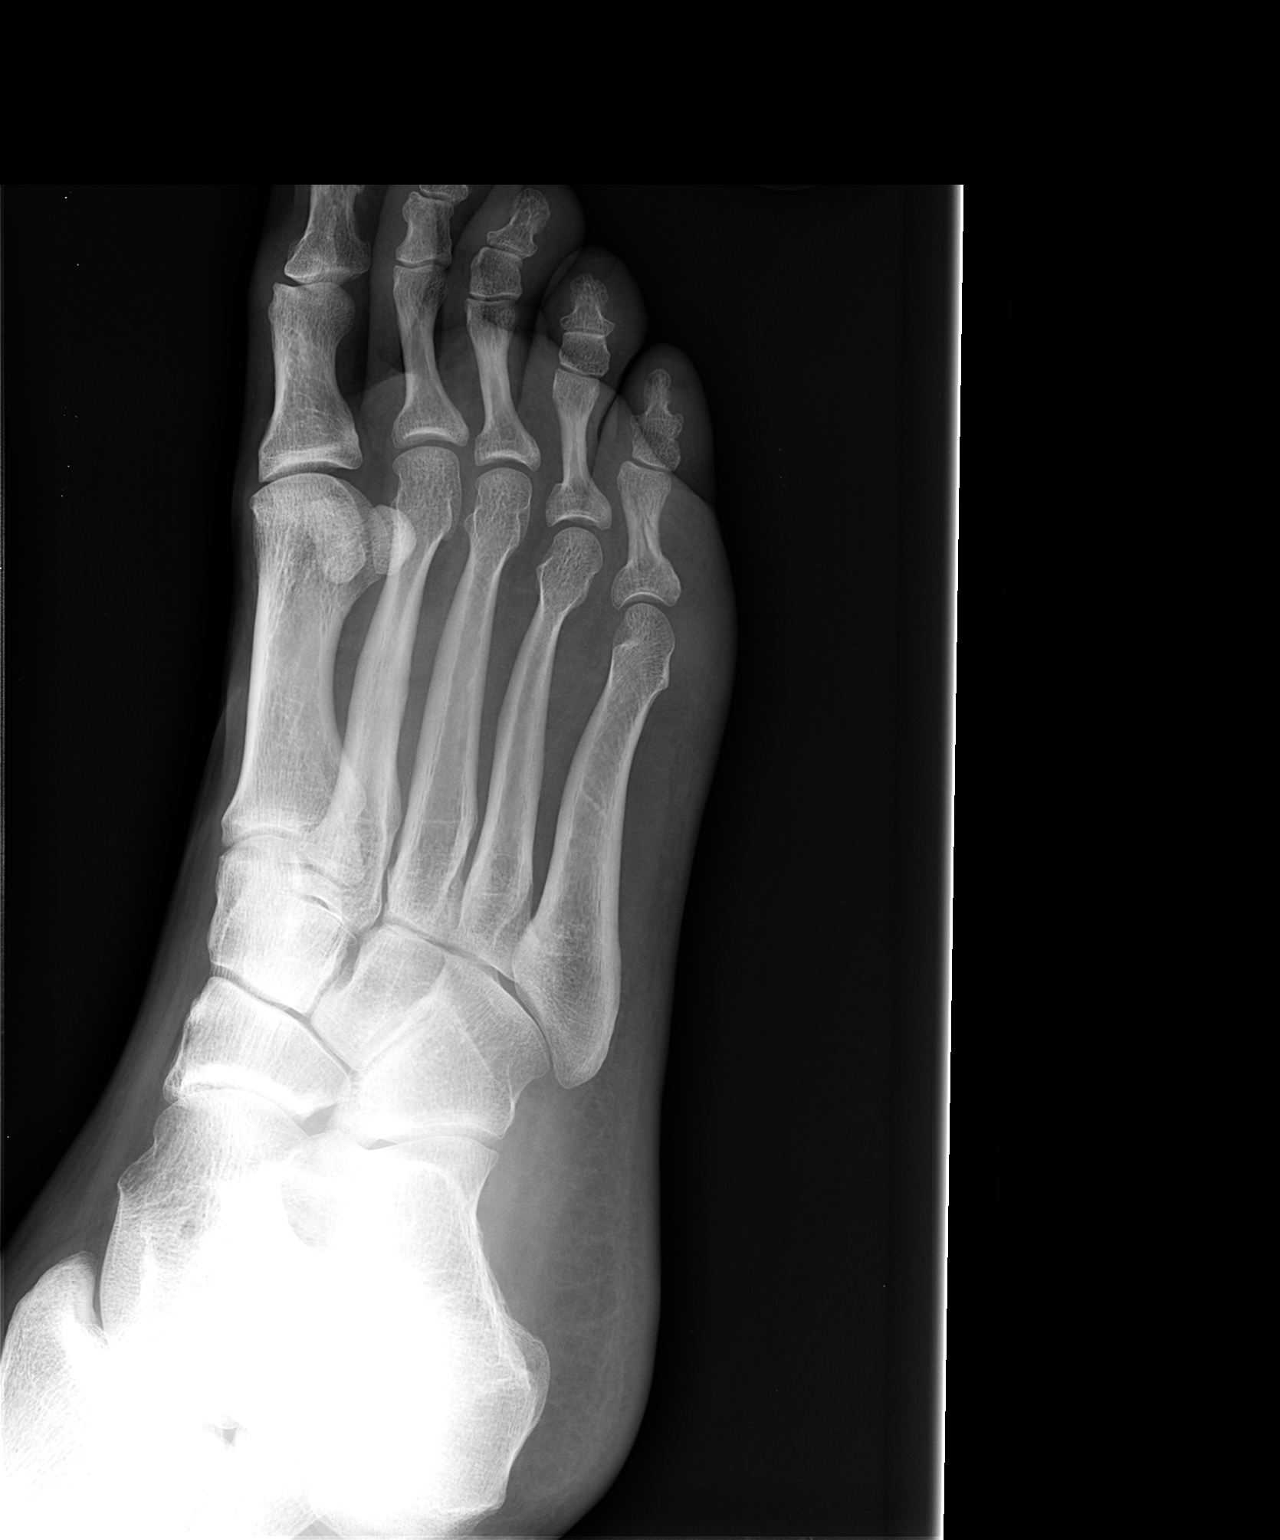

[view not recorded (3 of 3)]
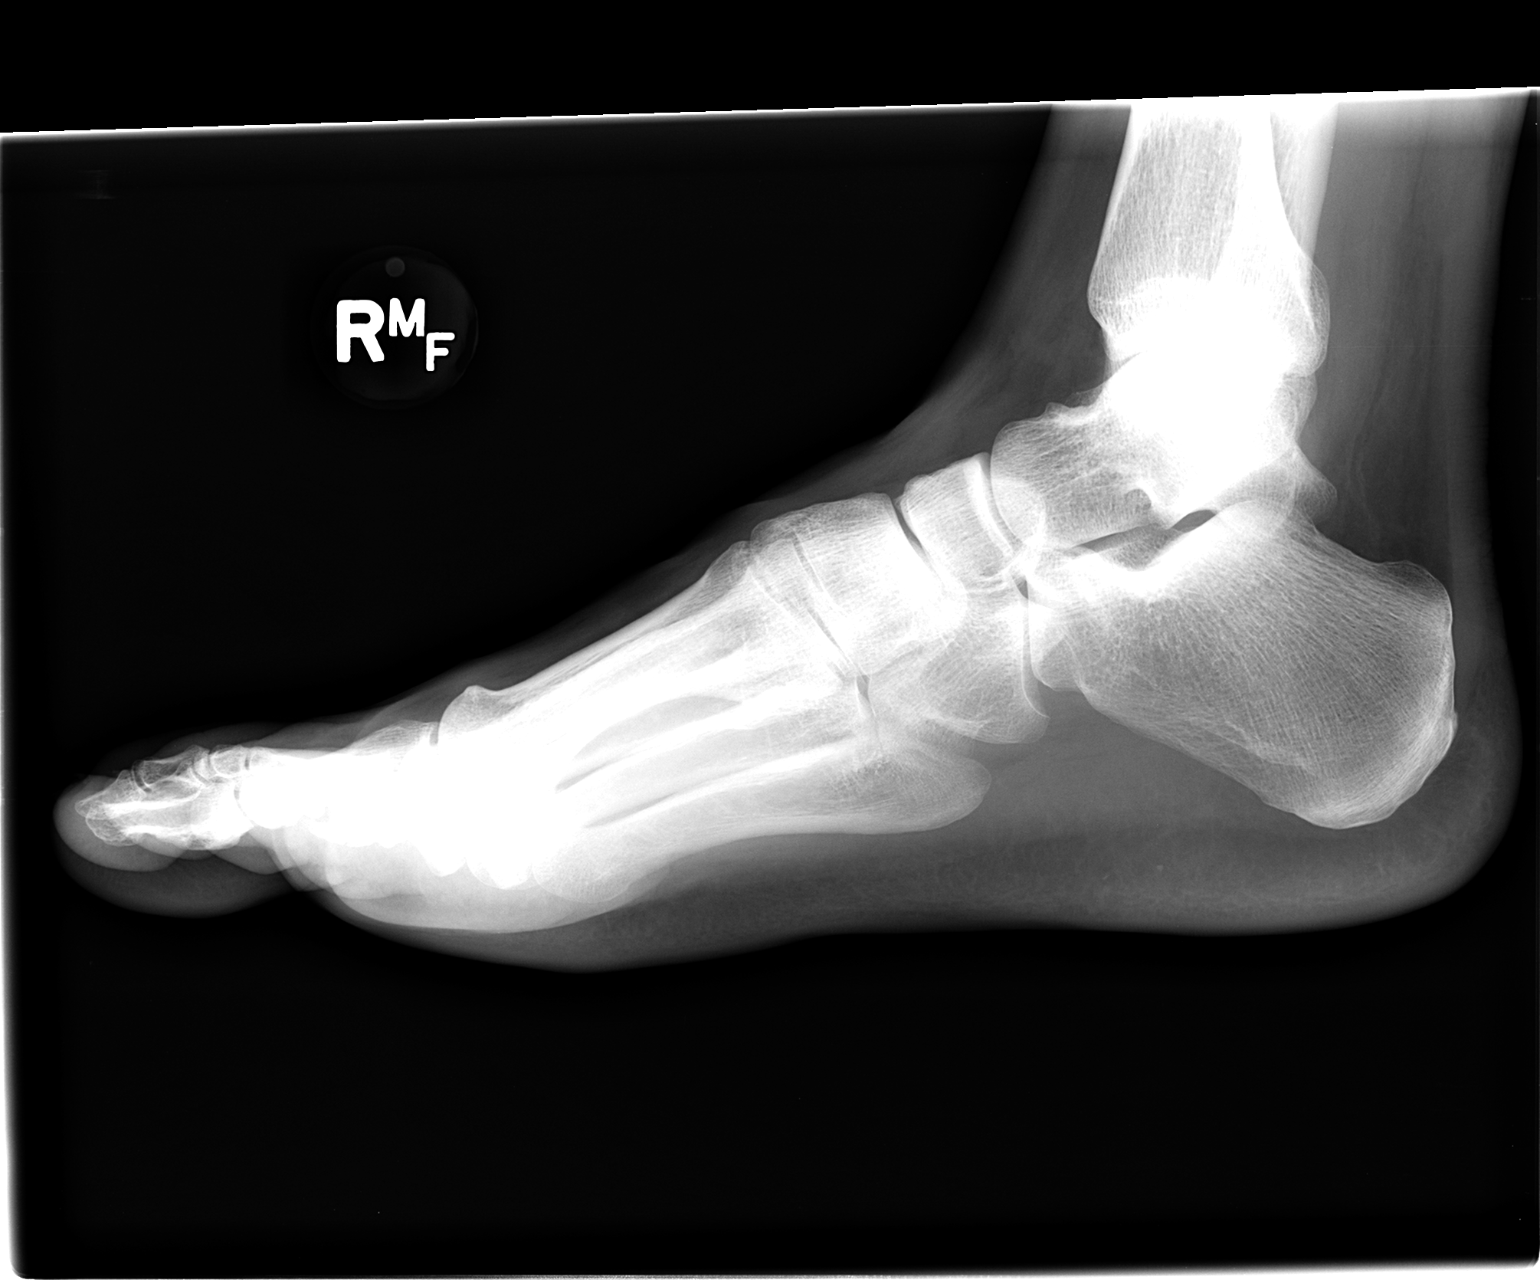

[3 of 3 positions shown; findings below may reference images not displayed]

FINDINGS: There is an oblique fracture involving the fifth proximal phalanx.
The fracture fragments appear nondisplaced. There is no additional
fractures or subluxations. No radiopaque foreign bodies are soft
tissue calcifications.
IMPRESSION: Acute fracture involves the fifth proximal phalanx.

## 2015-03-04 ENCOUNTER — Other Ambulatory Visit: Payer: Self-pay | Admitting: Family Medicine

## 2015-03-04 ENCOUNTER — Telehealth: Payer: Self-pay | Admitting: *Deleted

## 2015-03-04 MED ORDER — OSELTAMIVIR PHOSPHATE 75 MG PO CAPS: 75.0000 mg | ORAL_CAPSULE | Freq: Every day | ORAL | Status: DC

## 2015-03-04 NOTE — Telephone Encounter (Signed)
I'll send in tamiflu and be sure to tell him to take it 1 tab a day as PREVENTATIVE.  Don't hold the rx and fill only if he gets sx's.-thx

## 2015-03-04 NOTE — Telephone Encounter (Signed)
Pt LMOM on 03/03/15 at 4:09pm stating that he daughter was just Dx with the flu. He stated that he is getting ready to leave town Saturday and would like to have a Rx for Tamiflu called in in case he starts to have symptoms. He stated that he did not get the flu shot. Please advise. Thanks.

## 2015-03-04 NOTE — Telephone Encounter (Signed)
Left detailed message on home vm, okay per DPR.  

## 2017-10-14 ENCOUNTER — Ambulatory Visit: Payer: Self-pay | Admitting: Family Medicine

## 2017-10-15 ENCOUNTER — Ambulatory Visit: Payer: BC Managed Care – PPO | Admitting: Family Medicine

## 2017-10-15 ENCOUNTER — Ambulatory Visit (INDEPENDENT_AMBULATORY_CARE_PROVIDER_SITE_OTHER): Payer: BLUE CROSS/BLUE SHIELD | Admitting: Family Medicine

## 2017-10-15 ENCOUNTER — Encounter: Payer: Self-pay | Admitting: Family Medicine

## 2017-10-15 VITALS — BP 128/84 | HR 67 | Temp 98.0°F | Resp 20 | Ht 74.0 in | Wt 225.2 lb

## 2017-10-15 DIAGNOSIS — M79642 Pain in left hand: Secondary | ICD-10-CM

## 2017-10-15 DIAGNOSIS — M7711 Lateral epicondylitis, right elbow: Secondary | ICD-10-CM | POA: Diagnosis not present

## 2017-10-15 DIAGNOSIS — M79641 Pain in right hand: Secondary | ICD-10-CM | POA: Diagnosis not present

## 2017-10-15 MED ORDER — MELOXICAM 15 MG PO TABS: 15.0000 mg | ORAL_TABLET | Freq: Every day | ORAL | 0 refills | Status: DC

## 2017-10-15 NOTE — Patient Instructions (Addendum)
Start mobic daily with food.  It works by decreasing inflammation on a daily level.   Follow up with Dr. Boyd Kerbs in 4 weeks

## 2017-10-15 NOTE — Progress Notes (Signed)
Robert Hutchinson , 1980-07-23, 37 y.o., male MRN: [id number] Patient Care Team    Relationship Specialty Notifications Start End  McGowen, Maryjean Morn, MD PCP - General Family Medicine  01/03/11     Chief Complaint  Patient presents with  . Hand Pain    bilateral, and elbow pain     Subjective: Pt presents for an OV with complaints of bilateral hand and right elbow pain of 1 year duration, worsening over last few months.  Associated symptoms include bilateral hands feel "sore" and "on fire ".  He reports this is worse in the morning or after rest mildly improved throughout the day but constant.  States his fingers feel "tight "and occasionally he will notice some mild swelling.  No weakness.  But does admit if holding an object for a length of time his grip strength weekends.  States when the pain is present is approximately 6/10.  He also has struggled with right elbow pain permanently for few years, in which he wears a brace/band for tennis elbow when it flares.  He takes NSAIDs when pain occurs and can be helpful.  He has psoriasis of the skin and currently on Stelara.  He presents today wondering if there is something he can do for the discomfort concerns over psoriatic arthritis development.  No flowsheet data found.  No Known Allergies Social History   Tobacco Use  . Smoking status: Never Smoker  . Smokeless tobacco: Never Used  Substance Use Topics  . Alcohol use: Yes    Comment: twice a week   Past Medical History:  Diagnosis Date  . Heart murmur    functional  . Psoriasis Dxd age 89   Sees derm in [place]: on enbrel 2010-2016, then switched to [facility]   Past Surgical History:  Procedure Laterality Date  . APPENDECTOMY  1998   Family History  Problem Relation Age of Onset  . Other Father        Meningioma  . Cancer Mother        kidney cancer about age 59  . Cancer Maternal Grandmother        colon   Allergies as of 10/15/2017   No Known Allergies       Medication List        Accurate as of 10/15/17 10:46 AM. Always use your most recent med list.          ibuprofen 200 MG tablet Commonly known as:  ADVIL,MOTRIN Take three tablets ( 600 milligrams total) every 6 with food as needed for pain.   multivitamin tablet Take 1 tablet by mouth daily.   STELARA 90 MG/ML Sosy injection Generic drug:  ustekinumab       All past medical history, surgical history, allergies, family history, immunizations andmedications were updated in the EMR today and reviewed under the history and medication portions of their EMR.     ROS: Negative, with the exception of above mentioned in HPI   Objective:  BP 128/84 (BP Location: Left Arm, Patient Position: Sitting, Cuff Size: Large)   Pulse 67   Temp 98 F (36.7 C)   Resp 20   Ht 6\' 2"  (1.88 m)   Wt 225 lb 4 oz (102.2 kg)   SpO2 99%   BMI 28.92 kg/m  Body mass index is 28.92 kg/m. Gen: Afebrile. No acute distress. Nontoxic in appearance, well developed, well nourished.  HENT: AT. [place].MMM, no oral lesions.  Eyes:Pupils Equal Round Reactive to light, Extraocular movements  intact,  Conjunctiva without redness, discharge or icterus. MSK (bilateral hands and elbows): No erythema, no soft tissue swelling.  No tenderness to palpation over bony surfaces.  Full range of motion without discomfort, with the exception of mild elbow discomfort with resisted supination. negative Finkelstein, negative Tinel's at bilateral wrists, negative Phalen's bilateral wrist, positive Tinel's right lateral elbow.  Equal 5/5 bilateral grip strength.  Neurovascularly intact distally. Skin: no obvious rashes, purpura or petechiae.  Neuro:  Normal gait. PERLA. EOMi. Alert. Oriented x3  Psych: Normal affect, dress and demeanor. Normal speech. Normal thought content and judgment.  No exam data present No results found. No results found for this or any previous visit (from the past 24 hour(s)).  Assessment/Plan: Robert Hutchinson is a 37 y.o. male present for OV for  Pain in both hands/right lateral epicondylitis -Exam is essentially normal today.  No red flags.  Discussed possible causes of his discomfort to be mild osteoarthritis or even potentially early psoriatic arthritis versus overuse/repetitive motion causing discomfort. -His psoriatic skin condition is managed with Stelara by dermatology, appears to be decently controlled. -For his right lateral epicondylitis he will continue to use his tennis elbow band daily.  He can also consider picking up a elastic compression sleeve with padding for elbow to prevent pressure to the elbow during working hours. -For discomfort of his hands and right tennis elbow he is agreeable to try daily meloxicam as first-line.  Meloxicam 15 mg daily with food #90 prescribed to him today. -Follow up with his PCP in 4 to 6 weeks   Reviewed expectations re: course of current medical issues.  Discussed self-management of symptoms.  Outlined signs and symptoms indicating need for more acute intervention.  Patient verbalized understanding and all questions were answered.  Patient received an After-Visit Summary.    No orders of the defined types were placed in this encounter.   Note is dictated utilizing voice recognition software. Although note has been proof read prior to signing, occasional typographical errors still can be missed. If any questions arise, please do not hesitate to call for verification.   electronically signed by:  Felix Pacini, DO  [facility] - [place]

## 2017-10-16 ENCOUNTER — Encounter: Payer: Self-pay | Admitting: Family Medicine

## 2017-11-12 ENCOUNTER — Encounter: Payer: Self-pay | Admitting: Family Medicine

## 2017-11-12 ENCOUNTER — Ambulatory Visit (INDEPENDENT_AMBULATORY_CARE_PROVIDER_SITE_OTHER): Payer: BLUE CROSS/BLUE SHIELD | Admitting: Family Medicine

## 2017-11-12 VITALS — BP 119/62 | HR 67 | Temp 97.7°F | Resp 16 | Ht 74.0 in | Wt 230.2 lb

## 2017-11-12 DIAGNOSIS — M25542 Pain in joints of left hand: Secondary | ICD-10-CM

## 2017-11-12 DIAGNOSIS — M25541 Pain in joints of right hand: Secondary | ICD-10-CM | POA: Diagnosis not present

## 2017-11-12 MED ORDER — MELOXICAM 15 MG PO TABS: 15.0000 mg | ORAL_TABLET | Freq: Every day | ORAL | 3 refills | Status: DC

## 2017-11-12 NOTE — Progress Notes (Signed)
OFFICE VISIT  11/12/2017   CC:  Chief Complaint  Patient presents with  . Follow-up    arthritic pain    HPI:    Patient is a 38 y.o.  male who presents for 4 wk f/u arthritic pain. He was seen here by Dr. Claiborne Billings 1 mo ago for bilat hand pain and right elbow pain of about 1 yr duration. Dx was lateral epicondylitis and nonspecific arthralgias---exam completely normal. Meloxicam 15mg  qd started.  He states he responded VERY well to meloxicam after only a couple days! Grip strength very good now.  No side effects. He started a Water quality scientist, works with hands a lot and cannot limit hands/fingers use. If he misses only a couple days of meloxicam he gets return of sx's significantly.  ROS: no abd pain, no melena, no HAs, no dizziness, no palpitations, no joint swelling or erythema.  Past Medical History:  Diagnosis Date  . Heart murmur    functional  . Psoriasis Dxd age 30   Sees derm in [place]: on enbrel 2010-2016, then switched to [facility]    Past Surgical History:  Procedure Laterality Date  . APPENDECTOMY  1998    Outpatient Medications Prior to Visit  Medication Sig Dispense Refill  . Multiple Vitamin (MULTIVITAMIN) tablet Take 1 tablet by mouth daily.      . STELARA 90 MG/ML SOSY injection     . ibuprofen (ADVIL,MOTRIN) 200 MG tablet Take three tablets ( 600 milligrams total) every 6 with food as needed for pain. 30 tablet 0  . meloxicam (MOBIC) 15 MG tablet Take 1 tablet (15 mg total) by mouth daily. 90 tablet 0   No facility-administered medications prior to visit.     No Known Allergies  ROS As per HPI  PE: Blood pressure 119/62, pulse 67, temperature 97.7 F (36.5 C), temperature source Oral, resp. rate 16, height 6\' 2"  (1.88 m), weight 230 lb 4 oz (104.4 kg), SpO2 99 %. Body mass index is 29.56 kg/m.  Gen: Alert, well appearing.  Patient is oriented to person, place, time, and situation. AFFECT: pleasant, lucid thought and speech. No further exam  today.  LABS:    Chemistry      Component Value Date/Time   NA 136 03/10/2014 1059   K 4.2 03/10/2014 1059   CL 100 03/10/2014 1059   CO2 29 03/10/2014 1059   BUN 8 03/10/2014 1059   CREATININE 1.06 03/10/2014 1059      Component Value Date/Time   CALCIUM 9.3 03/10/2014 1059   ALKPHOS 76 03/10/2014 1059   AST 24 03/10/2014 1059   ALT 71 (H) 03/10/2014 1059   BILITOT 0.7 03/10/2014 1059      IMPRESSION AND PLAN:  Bilat hands/fingers overuse arthralgia.  I think this is more likely than actual psoriatic arthritis or osteoarthritis at this time. He is doing very well on daily meloxicam 15mg  qd.  He understands not to use any other NSAIDs with this med, understands side effects to look for, takes it with food. His derm MD does monitor liver testing/kidney testing annually. These have all been normal per pt report today.  Spent 15 min with pt today, with >50% of this time spent in counseling and care coordination regarding the above problems.  An After Visit Summary was printed and given to the patient.  FOLLOW UP: Return for as needed.  Signed:  Santiago Bumpers, MD           11/12/2017

## 2018-02-21 ENCOUNTER — Telehealth: Payer: Self-pay | Admitting: Family Medicine

## 2018-02-21 NOTE — Telephone Encounter (Signed)
Patient called and advised there should be refills of Meloxicam at [facility], it was sent on 11/26/17 #90/3 refills. He says the bottle he has shows 0 refills, so he will call them. I advised if they did not receive the prescription on 11/19, to call us back so that we can resend it, he verbalized understanding.

## 2018-02-21 NOTE — Telephone Encounter (Signed)
Copied from CRM [id number]. Topic: Quick Communication - Rx Refill/Question >> Feb 21, 2018  3:57 PM Tamela Oddi wrote: Medication: meloxicam (MOBIC) 15 MG tablet  Patient called to request a refill for the above medication  Preferred Pharmacy (with phone number or street name): [id number] - [place], [place] - [address] AT [facility] [address] [facility] [address] [phone] (Phone) [phone] (Fax)

## 2019-03-14 ENCOUNTER — Ambulatory Visit: Payer: Self-pay | Attending: Internal Medicine

## 2019-03-14 DIAGNOSIS — Z23 Encounter for immunization: Secondary | ICD-10-CM | POA: Insufficient documentation

## 2019-03-14 NOTE — Progress Notes (Signed)
   Covid-19 Vaccination Clinic  Name:  Bayan Kushnir    MRN: [id number] DOB: 11/18/1980  03/14/2019  Mr. Grulke was observed post Covid-19 immunization for 15 minutes without incident. He was provided with Vaccine Information Sheet and instruction to access the V-Safe system.   Mr. Pitta was instructed to call 911 with any severe reactions post vaccine: Marland Kitchen Difficulty breathing  . Swelling of face and throat  . A fast heartbeat  . A bad rash all over body  . Dizziness and weakness   Immunizations Administered    Name Date Dose VIS Date Route   Pfizer COVID-19 Vaccine 03/14/2019  2:43 PM 0.3 mL 12/19/2018 Intramuscular   Manufacturer: [employer], [employer]   Lot: [id number]   NDC: [address]-5

## 2019-04-04 ENCOUNTER — Ambulatory Visit: Payer: Self-pay | Attending: Internal Medicine

## 2019-04-04 DIAGNOSIS — Z23 Encounter for immunization: Secondary | ICD-10-CM

## 2019-04-04 NOTE — Progress Notes (Signed)
   Covid-19 Vaccination Clinic  Name:  Paulanthony Gleaves    MRN: [id number] DOB: 04/13/1980  04/04/2019  Mr. Gartin was observed post Covid-19 immunization for 15 minutes without incident. He was provided with Vaccine Information Sheet and instruction to access the V-Safe system.   Mr. Bowring was instructed to call 911 with any severe reactions post vaccine: Marland Kitchen Difficulty breathing  . Swelling of face and throat  . A fast heartbeat  . A bad rash all over body  . Dizziness and weakness   Immunizations Administered    Name Date Dose VIS Date Route   Pfizer COVID-19 Vaccine 04/04/2019  2:14 PM 0.3 mL 12/19/2018 Intramuscular   Manufacturer: [employer], [employer]   Lot: [id number]   NDC: [address]-7

## 2019-04-15 ENCOUNTER — Ambulatory Visit: Payer: Self-pay

## 2019-12-21 ENCOUNTER — Other Ambulatory Visit: Payer: Self-pay

## 2019-12-22 ENCOUNTER — Ambulatory Visit: Payer: 59 | Admitting: Family Medicine

## 2019-12-22 ENCOUNTER — Encounter: Payer: Self-pay | Admitting: Family Medicine

## 2019-12-22 VITALS — BP 118/77 | HR 74 | Temp 98.0°F | Resp 16 | Ht 74.0 in | Wt 247.8 lb

## 2019-12-22 DIAGNOSIS — M19042 Primary osteoarthritis, left hand: Secondary | ICD-10-CM | POA: Diagnosis not present

## 2019-12-22 DIAGNOSIS — M19041 Primary osteoarthritis, right hand: Secondary | ICD-10-CM | POA: Diagnosis not present

## 2019-12-22 LAB — BASIC METABOLIC PANEL
BUN: 14 mg/dL (ref 6–23)
CO2: 25 mEq/L (ref 19–32)
Calcium: 9.5 mg/dL (ref 8.4–10.5)
Chloride: 105 mEq/L (ref 96–112)
Creatinine, Ser: 0.84 mg/dL (ref 0.40–1.50)
GFR: 109.79 mL/min (ref >60.00)
Glucose, Bld: 96 mg/dL (ref 70–99)
Potassium: 4.2 mEq/L (ref 3.5–5.1)
Sodium: 138 mEq/L (ref 135–145)

## 2019-12-22 MED ORDER — CELECOXIB 100 MG PO CAPS: 100.0000 mg | ORAL_CAPSULE | Freq: Two times a day (BID) | ORAL | 1 refills | Status: AC

## 2019-12-22 NOTE — Progress Notes (Signed)
Office Note 12/22/2019  CC:  Chief Complaint  Patient presents with  . Joint pain    Would like refill for meloxicam   HPI:  Robert Hutchinson is a 39 y.o. male who is here for f/u joint pains. I have not seen him since November 2019. Bilat fingers, hands, all joints, R hand swollen in morning but resolves as day goes on. Has been referred to rheum MD by dermatologist--appt 01/27/20.  Recent L elbow pain, dx'd with medial epicondylitis at [facility] recently. Celebrex was rx'd for this and it helped his hands very well (100 bid qd for a month, now out of med).  Was on meloxicam daily prior.  Wants to get rx for celebrex to continue bid use. No side effects from med.  Says liver testing and TB screening neg/nl at derm MD recently. He continues on stelara for his psoriasis.  No other joint complaints.   Past Medical History:  Diagnosis Date  . Heart murmur    functional  . Psoriasis Dxd age 47   Sees derm in [place]: on enbrel 2010-2016, then switched to [facility]    Past Surgical History:  Procedure Laterality Date  . APPENDECTOMY  1998    Family History  Problem Relation Age of Onset  . Other Father        Meningioma  . Cancer Mother        kidney cancer about age 9  . Cancer Maternal Grandmother        colon    Social History   Socioeconomic History  . Marital status: Married    Spouse name: Belenda Cruise  . Number of children: Not on file  . Years of education: Not on file  . Highest education level: Not on file  Occupational History  . Occupation: Runner, broadcasting/film/video  Tobacco Use  . Smoking status: Never Smoker  . Smokeless tobacco: Never Used  Substance and Sexual Activity  . Alcohol use: Yes    Comment: twice a week  . Drug use: No  . Sexual activity: Not on file  Other Topics Concern  . Not on file  Social History Narrative   Married, moved to [place] in 2005.   Has one son 2 and 1/2 y/o.   Teacher at [employer].  Went to [employer].   No Tob, no signif alc,  no drugs.   Exercise: cardio and lt wt 3-5 times per week.      Social Determinants of Health   Financial Resource Strain: Not on file  Food Insecurity: Not on file  Transportation Needs: Not on file  Physical Activity: Not on file  Stress: Not on file  Social Connections: Not on file  Intimate Partner Violence: Not on file    Outpatient Medications Prior to Visit  Medication Sig Dispense Refill  . Multiple Vitamin (MULTIVITAMIN) tablet Take 1 tablet by mouth daily.    . STELARA 90 MG/ML SOSY injection     . meloxicam (MOBIC) 15 MG tablet Take 1 tablet (15 mg total) by mouth daily. (Patient not taking: Reported on 12/22/2019) 90 tablet 3   No facility-administered medications prior to visit.    No Known Allergies  ROS Review of Systems  Constitutional: Negative for appetite change, chills, fatigue and fever.  HENT: Negative for congestion, dental problem, ear pain and sore throat.   Eyes: Negative for discharge, redness and visual disturbance.  Respiratory: Negative for cough, chest tightness, shortness of breath and wheezing.   Cardiovascular: Negative for chest pain, palpitations and  leg swelling.  Gastrointestinal: Negative for abdominal pain, blood in stool, diarrhea, nausea and vomiting.  Genitourinary: Negative for difficulty urinating, dysuria, flank pain, frequency, hematuria and urgency.  Musculoskeletal: Positive for arthralgias (bilat hands/finger) and joint swelling (R hand). Negative for back pain, myalgias and neck stiffness.  Skin: Negative for pallor and rash.  Neurological: Negative for dizziness, speech difficulty, weakness and headaches.  Hematological: Negative for adenopathy. Does not bruise/bleed easily.  Psychiatric/Behavioral: Negative for confusion and sleep disturbance. The patient is not nervous/anxious.     PE; Vitals with BMI 12/22/2019 11/12/2017 10/15/2017  Height 6\' 2"  6\' 2"  6\' 2"   Weight 247 lbs 13 oz 230 lbs 4 oz 225 lbs 4 oz  BMI 31.8  29.55 28.91  Systolic 118 119  Diastolic 77 62 84  Pulse 74 67 67     Gen: Alert, well appearing.  Patient is oriented to person, place, time, and situation. AFFECT: pleasant, lucid thought and speech. HANDS: no swelling or stiffness or tenderness of any joints. Mild pinkish hue to MCP jts of index and middle finger on each hand but no distinct erythema, no warmth.  Pertinent labs:  Lab Results  Component Value Date   TSH 2.94 03/10/2014   Lab Results  Component Value Date   WBC 8.6 03/10/2014   HGB 14.6 03/10/2014   HCT 42.4 03/10/2014   MCV 83.0 03/10/2014   PLT 145.0 (L) 03/10/2014   Lab Results  Component Value Date   CREATININE 1.06 03/10/2014   BUN 8 03/10/2014   NA 136 03/10/2014   K 4.2 03/10/2014   CL 100 03/10/2014   CO2 29 03/10/2014   Lab Results  Component Value Date   ALT 71 (H) 03/10/2014   AST 24 03/10/2014   ALKPHOS 76 03/10/2014   BILITOT 0.7 03/10/2014   No results found for: CHOL No results found for: HDL No results found for: LDLCALC No results found for: TRIG No results found for: CHOLHDL No results found for: PSA No results found for: HGBA1C Lab Results  Component Value Date   ESRSEDRATE 28 (H) 03/10/2014   Lab Results  Component Value Date   CRP 3.9 03/10/2014    ASSESSMENT AND PLAN:   Bilat hands/finger arthralgias/? Arthriits. He has psoriasis and there is question of whether he has psoriatic arthritis. Derm has referred him to rheum and his initial appt is 01/27/20. I'll continue him on celebrex 100mg  bid. Discussed potential short and long term adverse effects for chronic NSAID use and pt expressed understanding. BMET today and q 6 mo.  An After Visit Summary was printed and given to the patient.  FOLLOW UP:  Return in about 6 months (around 06/21/2020) for annual CPE (fasting) + f/u chronic NSAID use for hands arthritis.  Signed:  05/10/2014, MD           12/22/2019

## 2020-01-26 NOTE — Progress Notes (Signed)
Office Visit Note  Patient: Robert Hutchinson             Date of Birth: 03/02/1980           MRN: [id number]             PCP: Tammi Sou, MD Referring: Nani Ravens,* Visit Date: 01/27/2020  Subjective:  New Patient (Initial Visit) (Patient has hx of psoriasis, patient complains of elbow and hand pain. )   History of Present Illness: Robert Hutchinson is a 40 y.o. male with psoriasis here for evaluation of joint pain in elbows and hands. His skin disease is recently stable taking Stelara for this with recent dermatology visit finding about 1-2% BSA involvement most rash on scalp.  He has been experiencing hand pain to some degree for years going back to the 2000's.  He has been on DMARDs for psoriasis pretty consistently for years and lately on the Stelara which he is tolerating well and has no trouble taking consistently.  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  He describes sometimes waking up with a burning type of pain in the hands.  Symptoms are often worse in the morning and improves within a couple hours.  He has not noticed any significant loss of grip strength or range of motion.  He notices swelling sometimes in the right hand not limited to specific joints seems like over the back of the hand mostly.  He does not report any specific joint injuries or joint surgeries.  He was also seen in the last year at urgent care for left elbow pain that was thought to represent medial epicondylitis.  In addition to DMARD he takes nonsteroidal anti-inflammatories previously meloxicam more recently Celebrex which is beneficial for his joint pain.  Previous DMARDs Enbrel - Weekly injections Otezla - GI intolerance  Activities of Daily Living:  Patient reports morning stiffness for 2 hours.   Patient Denies nocturnal pain.  Difficulty  dressing/grooming: Denies Difficulty climbing stairs: Denies Difficulty getting out of chair: Denies Difficulty using hands for taps, buttons, cutlery, and/or writing: Denies  Review of Systems  Constitutional: Negative for fatigue.  HENT: Negative for mouth sores, mouth dryness and nose dryness.   Eyes: Negative for pain, itching, visual disturbance and dryness.  Respiratory: Negative for cough, hemoptysis, shortness of breath and difficulty breathing.   Cardiovascular: Negative for chest pain, palpitations and swelling in legs/feet.  Gastrointestinal: Negative for abdominal pain, blood in stool, constipation and diarrhea.  Endocrine: Negative for increased urination.  Genitourinary: Negative for painful urination.  Musculoskeletal: Positive for arthralgias, joint pain, joint swelling and morning stiffness. Negative for myalgias, muscle weakness, muscle tenderness and myalgias.  Skin: Positive for color change, rash and redness.  Allergic/Immunologic: Negative for susceptible to infections.  Neurological: Negative for dizziness, numbness, headaches, memory loss and weakness.  Hematological: Negative for swollen glands.  Psychiatric/Behavioral: Negative for confusion and sleep disturbance.    PMFS History:  Patient Active Problem List   Diagnosis Date Noted  . Bilateral hand pain 01/27/2020  . Medial epicondylitis of elbow, left 01/27/2020  . Psoriasis 01/05/2011    Past Medical History:  Diagnosis Date  . Heart murmur    functional  . Psoriasis Dxd age 52   Sees derm in [place]: on enbrel 2010-2016, then switched to [facility]  History  Problem Relation Age of Onset  . Other Father        Meningioma  . Cancer Mother        kidney cancer about age 31  . Cancer Maternal Grandmother        colon   Past Surgical History:  Procedure Laterality Date  . APPENDECTOMY  1998   Social History   Social History Narrative   Married, moved to [place] in 2005.   Has one son  2 and 1/2 y/o.   Educ: Pharmacist, [employer] in [place].   Occup: formerly a Pharmacist, hospital at [employer].     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   No Tob, no signif alc, no drugs.   Exercise: cardio and lt wt 3-5 times per week.      Immunization History  Administered Date(s) Administered  . Hep A / Hep B 07/17/2010, 07/24/2010  . Influenza Split 01/05/2011, 09/19/2011  . MMR 07/17/2010  . PFIZER(Purple Top)SARS-COV-2 Vaccination 03/14/2019, 04/04/2019, 12/04/2019  . Tdap 07/17/2010     Objective: Vital Signs: BP (!) 143/83 (BP Location: Right Arm, Patient Position: Sitting, Cuff Size: Normal)   Pulse 77   Ht $R'6\' 1"'iy$  (1.854 m)   Wt 242 lb (109.8 kg)   BMI 31.93 kg/m    Physical Exam HENT:     Right Ear: External ear normal.     Left Ear: External ear normal.     Mouth/Throat:     Mouth: Mucous membranes are moist.     Pharynx: Oropharynx is clear.  Eyes:     Conjunctiva/sclera: Conjunctivae normal.  Cardiovascular:     Rate and Rhythm: Normal rate and regular rhythm.  Pulmonary:     Effort: Pulmonary effort is normal.     Breath sounds: Normal breath sounds.  Skin:    General: Skin is warm and dry.     Comments: Few lightly scaled erythematous patches on the sides of the head underneath hair and behind the ear  Neurological:     General: No focal deficit present.     Mental Status: He is alert.  Psychiatric:        Mood and Affect: Mood normal.     Musculoskeletal Exam:  Neck full range of motion no tenderness Shoulder, elbow, wrist, fingers full range of motion no tenderness or swelling Mild right lumbosacral paraspinal tenderness to palpation Knees, ankles full range of motion no tenderness or swelling  Limited ultrasound inspection of right hand MCPs and flexor and extensor tendons unremarkable for effusions or color Doppler enhancement  Investigation: No additional findings.  Imaging: XR Hand 2 View  Left  Result Date: 01/27/2020 X-ray left hand 2 views Radiocarpal carpal joint spaces appear normal.  MCP joints appear normal.  Normal-appearing PIP and DIP joint spaces.  No significant osteophytes or erosions seen.  Bone mineralization appears normal.  No soft tissue swelling seen. Impression No significant degenerative or inflammatory arthritis changes  XR Hand 2 View Right  Result Date: 01/27/2020 X-ray right hand 2 views Radiocarpal carpal joint spaces appear normal.  MCP joints appear normal.  Normal PIP and DIP joint spaces.  No appreciable osteophytes or erosions present.  Bone mineralization appears normal.  No soft tissue swelling seen. Impression No significant degenerative or inflammatory changes   Recent Labs: Lab Results  Component Value Date   WBC 8.6 03/10/2014   HGB 14.6 03/10/2014   PLT 145.0 (L)  03/10/2014   NA 138 12/22/2019   K 4.2 12/22/2019   CL 105 12/22/2019   CO2 25 12/22/2019   GLUCOSE 96 12/22/2019   BUN 14 12/22/2019   CREATININE 0.84 12/22/2019   BILITOT 0.7 03/10/2014   ALKPHOS 76 03/10/2014   AST 24 03/10/2014   ALT 71 (H) 03/10/2014   PROT 7.5 03/10/2014   ALBUMIN 4.5 03/10/2014   CALCIUM 9.5 12/22/2019    Speciality Comments: No specialty comments available.  Procedures:  No procedures performed Allergies: Patient has no known allergies.   Assessment / Plan:     Visit Diagnoses: Psoriasis - Plan: XR Hand 2 View Right, XR Hand 2 View Left, Sedimentation rate, C-reactive protein  Skin disease activity on scalp and sides of head but no plaques seen elsewhere. Currently on Stelara and using celebrex as needed for joint pain, not requiring much in the way of topical steroids.  Bilateral hand pain - Plan: XR Hand 2 View Right, XR Hand 2 View Left, Sedimentation rate, C-reactive protein  No obvious inflammatory disease activity is seen on exam or POCUS. Xrays are not remarkable for any erosive disease also not much OA change. Could certainly  represent PsA checking inflammatory markers today. If high levels can consider switching DMARD per patient preferences would consider 4 week interval dosing options. If negative markers plus no bone damage plus no overt inflammation seen would not recommend changing DMARD treatment at this time. Can certainly continue NSAID use for symptom management.  Medial epicondylitis of elbow, left  Not a major active complaint today, wrist and hand involvement more significant. No focal tenderness or swelling appreciated.  Orders: Orders Placed This Encounter  Procedures  . XR Hand 2 View Right  . XR Hand 2 View Left  . Sedimentation rate  . C-reactive protein   No orders of the defined types were placed in this encounter.    Follow-Up Instructions: No follow-ups on file.   Collier Salina, MD  Note - This record has been created using [employer].  Chart creation errors have been sought, but may not always  have been located. Such creation errors do not reflect on  the standard of medical care.

## 2020-01-27 ENCOUNTER — Ambulatory Visit: Payer: Self-pay

## 2020-01-27 ENCOUNTER — Ambulatory Visit (INDEPENDENT_AMBULATORY_CARE_PROVIDER_SITE_OTHER): Payer: 59

## 2020-01-27 ENCOUNTER — Ambulatory Visit: Payer: 59 | Admitting: Internal Medicine

## 2020-01-27 ENCOUNTER — Encounter: Payer: Self-pay | Admitting: Internal Medicine

## 2020-01-27 ENCOUNTER — Other Ambulatory Visit: Payer: Self-pay

## 2020-01-27 VITALS — BP 143/83 | HR 77 | Ht 73.0 in | Wt 242.0 lb

## 2020-01-27 DIAGNOSIS — M7702 Medial epicondylitis, left elbow: Secondary | ICD-10-CM | POA: Diagnosis not present

## 2020-01-27 DIAGNOSIS — M79642 Pain in left hand: Secondary | ICD-10-CM | POA: Diagnosis not present

## 2020-01-27 DIAGNOSIS — M79641 Pain in right hand: Secondary | ICD-10-CM | POA: Diagnosis not present

## 2020-01-27 DIAGNOSIS — L409 Psoriasis, unspecified: Secondary | ICD-10-CM

## 2020-01-27 NOTE — Patient Instructions (Addendum)
We will contact you within a few days to follow up based on your imaging and test results.  Erythrocyte Sedimentation Rate Test Why am I having this test? The erythrocyte sedimentation rate (ESR) test is used to help find illnesses related to:  Sudden (acute) or long-term (chronic) infections.  Inflammation.  The body's disease-fighting system attacking healthy cells (autoimmune diseases).  Cancer.  Tissue death. If you have symptoms that may be related to any of these illnesses, your health care provider may do an ESR test before doing more specific tests. If you have an inflammatory immune disease, such as rheumatoid arthritis, you may have this test to help monitor your therapy. What is being tested? This test measures how long it takes for your red blood cells (erythrocytes) to settle in a solution over a certain amount of time (sedimentation rate). When you have an infection or inflammation, your red blood cells clump together and settle faster. The sedimentation rate provides information about how much inflammation is present in the body. What kind of sample is taken? A blood sample is required for this test. It is usually collected by inserting a needle into a blood vessel.   How do I prepare for this test? Follow any instructions from your health care provider about changing or stopping your regular medicines. Tell a health care provider about:  Any allergies you have.  All medicines you are taking, including vitamins, herbs, eye drops, creams, and over-the-counter medicines.  Any blood disorders you have.  Any surgeries you have had.  Any medical conditions you have, such as thyroid or kidney disease.  Whether you are pregnant or may be pregnant. How are the results reported? Your results will be reported as a value that measures sedimentation rate in millimeters per hour (mm/hr). Your health care provider will compare your results to normal ranges that were established  after testing a large group of people (reference values). Reference values may vary among labs and hospitals. For this test, common reference values, which vary by age and gender, are:  Newborn: 0-2 mm/hr.  Child, up to puberty: 0-10 mm/hr.  Male: ? Under 50 years: 0-20 mm/hr. ? 50-85 years: 0-30 mm/hr. ? Over 85 years: 0-42 mm/hr.  Male: ? Under 50 years: 0-15 mm/hr. ? 50-85 years: 0-20 mm/hr. ? Over 85 years: 0-30 mm/hr. Certain conditions or medicines may cause ESR levels to be falsely lower or higher, such as:  Pregnancy.  Obesity.  Steroids, birth control pills, and blood thinners.  Thyroid or kidney disease. What do the results mean? Results that are within reference values are considered normal, meaning that the level of inflammation in your body is healthy. High ESR levels mean that there is inflammation in your body. You will have more tests to help make a diagnosis. Inflammation may result from many different conditions or injuries. Talk with your health care provider about what your results mean. Questions to ask your health care provider Ask your health care provider, or the department that is doing the test:  When will my results be ready?  How will I get my results?  What are my treatment options?  What other tests do I need?  What are my next steps? Summary  The erythrocyte sedimentation rate (ESR) test is used to help find illnesses associated with sudden (acute) or long-term (chronic) infections, inflammation, autoimmune diseases, cancer, or tissue death.  If you have symptoms that may be related to any of these illnesses, your health care provider may  do an ESR test before doing more specific tests. If you have an inflammatory immune disease, such as rheumatoid arthritis, you may have this test to help monitor your therapy.  This test measures how long it takes for your red blood cells (erythrocytes) to settle in a solution over a certain amount of  time (sedimentation rate). This provides information about how much inflammation is present in the body.   C-Reactive Protein Test Why am I having this test? The C-reactive protein (CRP) is a substance that the liver releases in response to inflammation within the body. You may have a CRP test to help diagnose:  Serious bacterial or fungal infections.  Inflammatory diseases, such as inflammatory bowel disease.  Lupus, rheumatoid arthritis, or other autoimmune diseases. What is being tested? This test checks for the level of CRP in your blood. The level of CRP in your body increases greatly after a heart attack, infection, or injury. What kind of sample is taken? A blood sample is required for this test. It is usually collected by inserting a needle into a blood vessel.   Tell a health care provider about:  All medicines you are taking, including vitamins, herbs, eye drops, creams, and over-the-counter medicines.  Any medical conditions you have.  The use of birth control pills or hormone replacement therapy such as estrogen.  Any blood disorders you have.  Whether you are pregnant or may be pregnant. How are the results reported? Your test results will be reported as a value that indicates how much CRP is in your blood. This will be reported as milligrams of CRP per liter (mg/L) of blood. Your health care provider will compare your results to normal ranges that were established after testing a large group of people (reference ranges). Reference ranges may vary among labs and hospitals. For this test, the standard CRP reference value is less than 10 mg/L. What do the results mean? A standard CRP test result that is less than 10 mg/L is considered normal, meaning that you do not have an abnormally high level of inflammation in your body. A standard CRP test result that is greater than 10 mg/L means that there is an abnormally high level of inflammation in your body that is causing CRP  to be released. Inflammation may result from many different conditions or injuries. You will have more tests to help make a diagnosis. Talk with your health care provider about what your results mean. Questions to ask your health care provider Ask your health care provider, or the department that is doing the test:  When will my results be ready?  How will I get my results?  What are my treatment options?  What other tests do I need?  What are my next steps? Summary  C-reactive protein (CPR) is a substance released by the liver in response to inflammation within the body.  The CRP test may be performed to help diagnose infection and inflammatory conditions.  Talk with your health care provider about what your results mean.

## 2020-01-28 LAB — SEDIMENTATION RATE: Sed Rate: 6 mm/h (ref 0–15)

## 2020-01-28 LAB — C-REACTIVE PROTEIN: CRP: 0.7 mg/L (ref <8.0)

## 2020-01-29 NOTE — Progress Notes (Signed)
Xrays of the hands show no significant erosions or degenerative changes of the joints. Blood test markers for active inflammatory disease are normal. Based on this and no obvious findings on physical exam I do not see evidence of active psoriatic arthritis at this time. I do not recommend changing psoriasis medications at this time since if present the arthritis is being reasonable controlled by current meds with no evidence of joint damage done.  We can follow up in about 3 months to recheck for worsening or improvement in case the symptoms are progressing over time.

## 2020-05-03 ENCOUNTER — Ambulatory Visit: Payer: 59 | Admitting: Internal Medicine

## 2020-05-03 NOTE — Progress Notes (Deleted)
Office Visit Note  Patient: Robert Hutchinson             Date of Birth: 1980/09/18           MRN: [id number]             PCP: Tammi Sou, MD Referring: Tammi Sou, MD Visit Date: 05/03/2020   Subjective:  No chief complaint on file.   History of Present Illness: Robert Hutchinson is a 40 y.o. male here for follow up ***     No Rheumatology ROS completed.   PMFS History:  Patient Active Problem List   Diagnosis Date Noted  . Bilateral hand pain 01/27/2020  . Medial epicondylitis of elbow, left 01/27/2020  . Psoriasis 01/05/2011    Past Medical History:  Diagnosis Date  . Heart murmur    functional  . Psoriasis Dxd age 50   Sees derm in [place]: on enbrel 2010-2016, then switched to [place] History  Problem Relation Age of Onset  . Other Father        Meningioma  . Cancer Mother        kidney cancer about age 14  . Cancer Maternal Grandmother        colon   Past Surgical History:  Procedure Laterality Date  . APPENDECTOMY  1998   Social History   Social History Narrative   Married, moved to [place] in 2005.   Has one son 2 and 1/2 y/o.   Educ: Pharmacist, [employer] in [place].   Occup: formerly a Pharmacist, hospital at [employer].     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   No Tob, no signif alc, no drugs.   Exercise: cardio and lt wt 3-5 times per week.      Immunization History  Administered Date(s) Administered  . Hep A / Hep B 07/17/2010, 07/24/2010  . Influenza Split 01/05/2011, 09/19/2011  . MMR 07/17/2010  . PFIZER(Purple Top)SARS-COV-2 Vaccination 03/14/2019, 04/04/2019, 12/04/2019  . Tdap 07/17/2010     Objective: Vital Signs: There were no vitals taken for this visit.   Physical Exam   Musculoskeletal Exam: ***  CDAI Exam: CDAI Score: -- Patient Global: --; Provider Global: -- Swollen: --; Tender: -- Joint Exam 05/03/2020   No joint exam has been documented for  this visit   There is currently no information documented on the homunculus. Go to the Rheumatology activity and complete the homunculus joint exam.  Investigation: No additional findings.  Imaging: No results found.  Recent Labs: Lab Results  Component Value Date   WBC 8.6 03/10/2014   HGB 14.6 03/10/2014   PLT 145.0 (L) 03/10/2014   NA 138 12/22/2019   K 4.2 12/22/2019   CL 105 12/22/2019   CO2 25 12/22/2019   GLUCOSE 96 12/22/2019   BUN 14 12/22/2019   CREATININE 0.84 12/22/2019   BILITOT 0.7 03/10/2014   ALKPHOS 76 03/10/2014   AST 24 03/10/2014   ALT 71 (H) 03/10/2014   PROT 7.5 03/10/2014   ALBUMIN 4.5 03/10/2014   CALCIUM 9.5 12/22/2019    Speciality Comments: No specialty comments available.  Procedures:  No procedures performed Allergies: Patient has no known allergies.   Assessment / Plan:     Visit Diagnoses: No diagnosis found.  ***  Orders: No orders of the defined types were placed in this encounter.  No orders of the defined types were placed in this  encounter.    Follow-Up Instructions: No follow-ups on file.   Collier Salina, MD  Note - This record has been created using [employer].  Chart creation errors have been sought, but may not always  have been located. Such creation errors do not reflect on  the standard of medical care.

## 2020-06-09 NOTE — Progress Notes (Deleted)
Office Note 06/09/2020  CC: No chief complaint on file.   HPI:  Robert Hutchinson is a 40 y.o. male who is here for annual health maintenance exam and 6 mo f/u chronic NSAID use for arthritis in hands. A/P as of last visit: "Bilat hands/finger arthralgias/? Arthriits. He has psoriasis and there is question of whether he has psoriatic arthritis. Derm has referred him to rheum and his initial appt is 01/27/20. I'll continue him on celebrex 100mg  bid. Discussed potential short and long term adverse effects for chronic NSAID use and pt expressed understanding. BMET today and q 6 mo."  INTERIM HX: ***    Past Medical History:  Diagnosis Date  . Heart murmur    functional  . Psoriasis Dxd age 13   Sees derm in [place]: on enbrel 2010-2016, then switched to [facility]    Past Surgical History:  Procedure Laterality Date  . APPENDECTOMY  1998    Family History  Problem Relation Age of Onset  . Other Father        Meningioma  . Cancer Mother        kidney cancer about age 69  . Cancer Maternal Grandmother        colon    Social History   Socioeconomic History  . Marital status: Married    Spouse name: 53  . Number of children: Not on file  . Years of education: Not on file  . Highest education level: Not on file  Occupational History  . Occupation: [employer]  Tobacco Use  . Smoking status: Never Smoker  . Smokeless tobacco: Never Used  Vaping Use  . Vaping Use: Never used  Substance and Sexual Activity  . Alcohol use: Yes    Comment: twice a week  . Drug use: No  . Sexual activity: Not on file  Other Topics Concern  . Not on file  Social History Narrative   Married, moved to [place] in 2005.   Has one son 2 and 1/2 y/o.   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   Occup: formerly a [place] at [employer].     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   No Tob, no signif alc, no drugs.   Exercise:  cardio and lt wt 3-5 times per week.      Social Determinants of Health   Financial Resource Strain: Not on file  Food Insecurity: Not on file  Transportation Needs: Not on file  Physical Activity: Not on file  Stress: Not on file  Social Connections: Not on file  Intimate Partner Violence: Not on file    Outpatient Medications Prior to Visit  Medication Sig Dispense Refill  . celecoxib (CELEBREX) 100 MG capsule Take 1 capsule (100 mg total) by mouth 2 (two) times daily. 180 capsule 1  . Multiple Vitamin (MULTIVITAMIN) tablet Take 1 tablet by mouth daily.    . STELARA 90 MG/ML SOSY injection every 8 (eight) weeks.     No facility-administered medications prior to visit.    No Known Allergies  ROS *** PE; Vitals with BMI 01/27/2020 01/27/2020 12/22/2019  Height - 6\' 1"  6\' 2"   Weight - 242 lbs 247 lbs 13 oz  BMI - 31.93 31.8  Systolic 143 147 12/24/2019  Diastolic 83 76 77  Pulse 77 76 74     *** Pertinent labs:  Lab Results  Component Value Date   TSH 2.94 03/10/2014   Lab  Results  Component Value Date   WBC 8.6 03/10/2014   HGB 14.6 03/10/2014   HCT 42.4 03/10/2014   MCV 83.0 03/10/2014   PLT 145.0 (L) 03/10/2014   Lab Results  Component Value Date   CREATININE 0.84 12/22/2019   BUN 14 12/22/2019   NA 138 12/22/2019   K 4.2 12/22/2019   CL 105 12/22/2019   CO2 25 12/22/2019   Lab Results  Component Value Date   ALT 71 (H) 03/10/2014   AST 24 03/10/2014   ALKPHOS 76 03/10/2014   BILITOT 0.7 03/10/2014    ASSESSMENT AND PLAN:   Health maintenance exam: Reviewed age and gender appropriate health maintenance issues (prudent diet, regular exercise, health risks of tobacco and excessive alcohol, use of seatbelts, fire alarms in home, use of sunscreen).  Also reviewed age and gender appropriate health screening as well as vaccine recommendations. Vaccines: ALL UTD Labs: fasting HP labs ordered.  Hep C screening->***. Prostate ca screening: average risk  patient= as per latest guidelines, start screening at 55 yrs of age. Colon ca screening: average risk patient= as per latest guidelines, start screening at 49 yrs of age.  An After Visit Summary was printed and given to the patient.  FOLLOW UP:  No follow-ups on file.  Signed:  Santiago Bumpers, MD           06/09/2020

## 2020-06-10 ENCOUNTER — Encounter: Payer: 59 | Admitting: Family Medicine

## 2020-06-10 DIAGNOSIS — Z5181 Encounter for therapeutic drug level monitoring: Secondary | ICD-10-CM

## 2020-06-10 DIAGNOSIS — Z1159 Encounter for screening for other viral diseases: Secondary | ICD-10-CM

## 2020-06-10 DIAGNOSIS — Z0000 Encounter for general adult medical examination without abnormal findings: Secondary | ICD-10-CM

## 2020-07-18 ENCOUNTER — Other Ambulatory Visit: Payer: Self-pay | Admitting: Family Medicine

## 2020-09-20 ENCOUNTER — Other Ambulatory Visit: Payer: Self-pay

## 2020-09-21 ENCOUNTER — Encounter: Payer: 59 | Admitting: Family Medicine

## 2020-09-21 NOTE — Progress Notes (Deleted)
Office Note 09/21/2020  CC: No chief complaint on file.   HPI:  Robert Hutchinson is a 40 y.o. male who is here annual health maintenance exam. A/P as of last visit 12/22/19: "Bilat hands/finger arthralgias/? Arthriits. He has psoriasis and there is question of whether he has psoriatic arthritis. Derm has referred him to rheum and his initial appt is 01/27/20. I'll continue him on celebrex 100mg  bid. Discussed potential short and long term adverse effects for chronic NSAID use and pt expressed understanding. BMET today and q 6 mo."  INTERIM HX: ***    Past Medical History:  Diagnosis Date   Heart murmur    functional   Psoriasis Dxd age 36   Sees derm in [place]: on enbrel 2010-2016, then switched to 01-06-2005    Past Surgical History:  Procedure Laterality Date   APPENDECTOMY  1998    Family History  Problem Relation Age of Onset   Other Father        Meningioma   Cancer Mother        kidney cancer about age 53   Cancer Maternal Grandmother        colon    Social History   Socioeconomic History   Marital status: Single    Spouse name: 53   Number of children: Not on file   Years of education: Not on file   Highest education level: Not on file  Occupational History   Occupation: [employer]  Tobacco Use   Smoking status: Never   Smokeless tobacco: Never  Vaping Use   Vaping Use: Never used  Substance and Sexual Activity   Alcohol use: Yes    Comment: twice a week   Drug use: No   Sexual activity: Not on file  Other Topics Concern   Not on file  Social History Narrative   Married, moved to [place].   Has one son 2 and 1/2 y/o.   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   Occup: formerly a [place] at [employer].     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   No Tob, no signif alc, no drugs.   Exercise: cardio and lt wt 3-5 times per week.      Social Determinants of Health    Financial Resource Strain: Not on file  Food Insecurity: Not on file  Transportation Needs: Not on file  Physical Activity: Not on file  Stress: Not on file  Social Connections: Not on file  Intimate Partner Violence: Not on file    Outpatient Medications Prior to Visit  Medication Sig Dispense Refill   celecoxib (CELEBREX) 100 MG capsule Take 1 capsule (100 mg total) by mouth 2 (two) times daily. 180 capsule 1   Multiple Vitamin (MULTIVITAMIN) tablet Take 1 tablet by mouth daily.     STELARA 90 MG/ML SOSY injection every 8 (eight) weeks.     No facility-administered medications prior to visit.    No Known Allergies  ROS *** PE; Vitals with BMI 01/27/2020 01/27/2020 12/22/2019  Height - 6\' 1"  6\' 2"   Weight - 242 lbs 247 lbs 13 oz  BMI - 31.93 31.8  Systolic 143 147 12/24/2019  Diastolic 83 76 77  Pulse 77 76 74     *** Pertinent labs:  Lab Results  Component Value Date   TSH 2.94 03/10/2014   Lab Results  Component Value Date   WBC 8.6 03/10/2014   HGB  14.6 03/10/2014   HCT 42.4 03/10/2014   MCV 83.0 03/10/2014   PLT 145.0 (L) 03/10/2014   Lab Results  Component Value Date   CREATININE 0.84 12/22/2019   BUN 14 12/22/2019   NA 138 12/22/2019   K 4.2 12/22/2019   CL 105 12/22/2019   CO2 25 12/22/2019   Lab Results  Component Value Date   ALT 71 (H) 03/10/2014   AST 24 03/10/2014   ALKPHOS 76 03/10/2014   BILITOT 0.7 03/10/2014   ASSESSMENT AND PLAN:   Health maintenance exam: Reviewed age and gender appropriate health maintenance issues (prudent diet, regular exercise, health risks of tobacco and excessive alcohol, use of seatbelts, fire alarms in home, use of sunscreen).  Also reviewed age and gender appropriate health screening as well as vaccine recommendations. Vaccines: Labs: fasting HP labs ordered. Prostate ca screening: average risk patient= as per latest guidelines, start screening at 31 yrs of age. Colon ca screening: average risk patient= as  per latest guidelines, start screening at 69 yrs of age.     An After Visit Summary was printed and given to the patient.  FOLLOW UP:  No follow-ups on file.  Signed:  Santiago Bumpers, MD           09/21/2020

## 2022-02-03 ENCOUNTER — Emergency Department (HOSPITAL_BASED_OUTPATIENT_CLINIC_OR_DEPARTMENT_OTHER)
Admission: EM | Admit: 2022-02-03 | Discharge: 2022-02-03 | Disposition: A | Discharge status: Home / Self Care | Payer: 59 | Attending: Emergency Medicine | Admitting: Emergency Medicine

## 2022-02-03 ENCOUNTER — Emergency Department (HOSPITAL_BASED_OUTPATIENT_CLINIC_OR_DEPARTMENT_OTHER): Payer: 59

## 2022-02-03 ENCOUNTER — Ambulatory Visit
Admission: EM | Admit: 2022-02-03 | Discharge: 2022-02-03 | Disposition: A | Discharge status: Home / Self Care | Payer: 59

## 2022-02-03 ENCOUNTER — Ambulatory Visit: Admit: 2022-02-03 | Payer: 59

## 2022-02-03 ENCOUNTER — Other Ambulatory Visit: Payer: Self-pay

## 2022-02-03 DIAGNOSIS — R112 Nausea with vomiting, unspecified: Secondary | ICD-10-CM | POA: Insufficient documentation

## 2022-02-03 DIAGNOSIS — Z1152 Encounter for screening for COVID-19: Secondary | ICD-10-CM | POA: Insufficient documentation

## 2022-02-03 DIAGNOSIS — H53149 Visual discomfort, unspecified: Secondary | ICD-10-CM

## 2022-02-03 DIAGNOSIS — R110 Nausea: Secondary | ICD-10-CM | POA: Diagnosis not present

## 2022-02-03 DIAGNOSIS — R519 Headache, unspecified: Secondary | ICD-10-CM

## 2022-02-03 DIAGNOSIS — R42 Dizziness and giddiness: Secondary | ICD-10-CM | POA: Diagnosis not present

## 2022-02-03 LAB — COMPREHENSIVE METABOLIC PANEL
ALT: 16 U/L (ref 0–44)
AST: 9 U/L — ABNORMAL LOW (ref 15–41)
Albumin: 4.4 g/dL (ref 3.5–5.0)
Alkaline Phosphatase: 49 U/L (ref 38–126)
Anion gap: 10 (ref 5–15)
BUN: 15 mg/dL (ref 6–20)
CO2: 22 mmol/L (ref 22–32)
Calcium: 9.7 mg/dL (ref 8.9–10.3)
Chloride: 104 mmol/L (ref 98–111)
Creatinine, Ser: 0.84 mg/dL (ref 0.61–1.24)
GFR, Estimated: >60 mL/min (ref >60)
Glucose, Bld: 116 mg/dL — ABNORMAL HIGH (ref 70–99)
Potassium: 4.1 mmol/L (ref 3.5–5.1)
Sodium: 136 mmol/L (ref 135–145)
Total Bilirubin: 0.7 mg/dL (ref 0.3–1.2)
Total Protein: 7.3 g/dL (ref 6.5–8.1)

## 2022-02-03 LAB — CBC
HCT: 40.7 % (ref 39.0–52.0)
Hemoglobin: 13.9 g/dL (ref 13.0–17.0)
MCH: 28.3 pg (ref 26.0–34.0)
MCHC: 34.2 g/dL (ref 30.0–36.0)
MCV: 82.7 fL (ref 80.0–100.0)
Platelets: 151 10*3/uL (ref 150–400)
RBC: 4.92 MIL/uL (ref 4.22–5.81)
RDW: 12.4 % (ref 11.5–15.5)
WBC: 6.4 10*3/uL (ref 4.0–10.5)
nRBC: 0 % (ref 0.0–0.2)

## 2022-02-03 LAB — RESP PANEL BY RT-PCR (RSV, FLU A&B, COVID)  RVPGX2
Influenza A by PCR: NEGATIVE
Influenza B by PCR: NEGATIVE
Resp Syncytial Virus by PCR: NEGATIVE
SARS Coronavirus 2 by RT PCR: NEGATIVE

## 2022-02-03 LAB — LIPASE, BLOOD: Lipase: <10 U/L — ABNORMAL LOW (ref 11–51)

## 2022-02-03 LAB — PROTIME-INR
INR: 1 (ref 0.8–1.2)
Prothrombin Time: 13.3 seconds (ref 11.4–15.2)

## 2022-02-03 MED ORDER — DIPHENHYDRAMINE HCL 50 MG/ML IJ SOLN
25.0000 mg | Freq: Once | INTRAMUSCULAR | Status: AC
  Administered 2022-02-03: 25 mg via INTRAVENOUS
  Filled 2022-02-03: qty 1

## 2022-02-03 MED ORDER — SODIUM CHLORIDE 0.9 % IV BOLUS
1000.0000 mL | Freq: Once | INTRAVENOUS | Status: AC
  Administered 2022-02-03: 1000 mL via INTRAVENOUS

## 2022-02-03 MED ORDER — KETOROLAC TROMETHAMINE 30 MG/ML IJ SOLN
30.0000 mg | Freq: Once | INTRAMUSCULAR | Status: AC
  Administered 2022-02-03: 30 mg via INTRAVENOUS
  Filled 2022-02-03: qty 1

## 2022-02-03 MED ORDER — DEXAMETHASONE SODIUM PHOSPHATE 10 MG/ML IJ SOLN
10.0000 mg | Freq: Once | INTRAMUSCULAR | Status: AC
  Administered 2022-02-03: 10 mg via INTRAVENOUS
  Filled 2022-02-03: qty 1

## 2022-02-03 MED ORDER — METOCLOPRAMIDE HCL 5 MG/ML IJ SOLN
10.0000 mg | Freq: Once | INTRAMUSCULAR | Status: AC
  Administered 2022-02-03: 10 mg via INTRAVENOUS
  Filled 2022-02-03: qty 2

## 2022-02-03 MED ORDER — IOHEXOL 350 MG/ML SOLN
100.0000 mL | Freq: Once | INTRAVENOUS | Status: AC | PRN
  Administered 2022-02-03: 75 mL via INTRAVENOUS

## 2022-02-03 NOTE — Discharge Instructions (Signed)
Your testing is reassuring.  CT scan is negative for acute finding.  No evidence of aneurysm.  COVID and flu test are negative.  Suspect likely migraine headache.  You declined lumbar puncture today.  Follow-up with your primary doctor as well as neurologist.  Return to the ED sooner with worsening headache, fever, difficulty moving your head side-to-side, vomiting, confusion, unilateral weakness, numbness, tingling, any other concerns.

## 2022-02-03 NOTE — ED Notes (Signed)
Patient is being discharged from the Urgent Care and sent to the Emergency Department via POV. Per Rosario Adie NP, patient is in need of higher level of care due to need for further evaluation. Patient is aware and verbalizes understanding of plan of care.  Vitals:   02/03/22 0939  BP: (!) 148/97  Pulse: 82  Resp: 16  Temp: 97.9 F (36.6 C)  SpO2: 95%

## 2022-02-03 NOTE — ED Triage Notes (Signed)
Pt c/o headache and intermittent nausea since Monday.   Home interventions: ibuprofen

## 2022-02-03 NOTE — ED Notes (Signed)
Pt given sprite for PO challenge, per his request. Pt able to drink without incident. NAD noted

## 2022-02-03 NOTE — ED Provider Notes (Signed)
[name] Provider Note   CSN: [id number] Arrival date & time: 02/03/22  1014     History  Chief Complaint  Patient presents with   Headache    Robert Hutchinson is a 42 y.o. male.  Patient presents to the ED with severe headache over the past 5 days.  States he is normally does not get headaches.  He reports headache started gradually about 5 days ago in her frontal area and has progressively worsened.  Denies thunderclap onset.  Headache does wax and wane in severity but is quite severe at times.  Associated with photophobia, phonophobia, nausea and several episodes of vomiting.  Denies fever but has had chills and felt warm all over as well as some body aches.  No focal weakness, numbness or tingling.  No difficulty speaking or difficulty swallowing.  No chest pain, shortness of breath, cough or congestion or sore throat.  He was seen in urgent care and referred to the ED because of the severe nature of his headaches.  Does not have a history of migraines.  States he normally does not get headaches. Received his first dose of Tremfya 3 weeks ago for psoriatic arthritis.  Before that he was on Stelara. Ibuprofen has not really helped the headache.  He is no pressure is elevated today with no history of the same.  Denies thunderclap onset of the headache but states the headache does become quite severe at times.  Denies any fevers but has had chills and feels achy all over.  The history is provided by the patient.  Headache Associated symptoms: myalgias, photophobia and weakness   Associated symptoms: no abdominal pain, no congestion, no cough, no fever, no nausea and no vomiting        Home Medications Prior to Admission medications   Medication Sig Start Date End Date Taking? Authorizing Provider  celecoxib (CELEBREX) 100 MG capsule Take 1 capsule (100 mg total) by mouth 2 (two) times daily. 12/22/19   McGowen, Adrian Blackwater, MD  Multiple  Vitamin (MULTIVITAMIN) tablet Take 1 tablet by mouth daily.    [provider]  STELARA 90 MG/ML SOSY injection every 8 (eight) weeks. 07/24/17   [provider]  TREMFYA 100 MG/ML SOPN Inject into the skin. 01/10/22   [provider]      Allergies    Patient has no known allergies.    Review of Systems   Review of Systems  Constitutional:  Negative for activity change, appetite change and fever.  HENT:  Negative for congestion and rhinorrhea.   Eyes:  Positive for photophobia.  Respiratory:  Negative for cough, chest tightness and shortness of breath.   Cardiovascular:  Negative for chest pain.  Gastrointestinal:  Negative for abdominal pain, nausea and vomiting.  Genitourinary:  Negative for dysuria and hematuria.  Musculoskeletal:  Positive for arthralgias and myalgias.  Skin:  Negative for rash.  Neurological:  Positive for weakness and headaches.   all other systems are negative except as noted in the HPI and PMH.    Physical Exam Updated Vital Signs BP (!) 162/105 (BP Location: Right Arm)   Pulse 77   Temp 98 F (36.7 C) (Oral)   Resp 18   Ht 6\' 1"  (1.854 m)   Wt 102.1 kg   SpO2 97%   BMI 29.69 kg/m  Physical Exam Vitals and nursing note reviewed.  Constitutional:      General: He is not in acute distress.  Appearance: He is well-developed.  HENT:     Head: Normocephalic and atraumatic.     Mouth/Throat:     Pharynx: No oropharyngeal exudate.     Comments: No significant photophobia Eyes:     Conjunctiva/sclera: Conjunctivae normal.     Pupils: Pupils are equal, round, and reactive to light.  Neck:     Comments: No meningismus. Full range of motion of neck without pain. Cardiovascular:     Rate and Rhythm: Normal rate and regular rhythm.     Heart sounds: Normal heart sounds. No murmur heard. Pulmonary:     Effort: Pulmonary effort is normal. No respiratory distress.     Breath sounds: Normal breath sounds.  Chest:     Chest  wall: No tenderness.  Abdominal:     Palpations: Abdomen is soft.     Tenderness: There is no abdominal tenderness. There is no guarding or rebound.  Musculoskeletal:        General: No tenderness. Normal range of motion.     Cervical back: Normal range of motion and neck supple.  Skin:    General: Skin is warm.     Capillary Refill: Capillary refill takes less than 2 seconds.  Neurological:     General: No focal deficit present.     Mental Status: He is alert and oriented to person, place, and time. Mental status is at baseline.     Cranial Nerves: No cranial nerve deficit.     Motor: No abnormal muscle tone.     Coordination: Coordination normal.     Comments:  5/5 strength throughout. CN 2-12 intact.Equal grip strength.   Psychiatric:        Behavior: Behavior normal.     ED Results / Procedures / Treatments   Labs (all labs ordered are listed, but only abnormal results are displayed) Labs Reviewed  LIPASE, BLOOD - Abnormal; Notable for the following components:      Result Value   Lipase <10 (*)    All other components within normal limits  COMPREHENSIVE METABOLIC PANEL - Abnormal; Notable for the following components:   Glucose, Bld 116 (*)    AST 9 (*)    All other components within normal limits  RESP PANEL BY RT-PCR (RSV, FLU A&B, COVID)  RVPGX2  CBC  PROTIME-INR    EKG None  Radiology CT ANGIO HEAD NECK W WO CM  Result Date: 02/03/2022 CLINICAL DATA:  42 year old male with sudden severe headache. EXAM: CT ANGIOGRAPHY HEAD AND NECK TECHNIQUE: Multidetector CT imaging of the head and neck was performed using the standard protocol during bolus administration of intravenous contrast. Multiplanar CT image reconstructions and MIPs were obtained to evaluate the vascular anatomy. Carotid stenosis measurements (when applicable) are obtained utilizing NASCET criteria, using the distal internal carotid diameter as the denominator. RADIATION DOSE REDUCTION: This exam was  performed according to the departmental dose-optimization program which includes automated exposure control, adjustment of the mA and/or kV according to patient size and/or use of iterative reconstruction technique. CONTRAST:  17mL OMNIPAQUE IOHEXOL 350 MG/ML SOLN COMPARISON:  None Available. FINDINGS: CT HEAD Brain: Cerebral volume appears to be at the lower limits of normal for age. No midline shift, ventriculomegaly, mass effect, evidence of mass lesion, intracranial hemorrhage or evidence of cortically based acute infarction. Gray-white matter differentiation is within normal limits throughout the brain. No encephalomalacia identified. Calvarium and skull base: Negative. Paranasal sinuses: Visualized paranasal sinuses and mastoids are clear. Orbits: Negative orbit and scalp soft tissues.  CTA NECK Skeleton: Negative. Upper chest: Negative. Other neck: Within normal limits. Aortic arch: 3 vessel arch configuration with no arch atherosclerosis. Right carotid system: Negative. Left carotid system: Negative. Vertebral arteries: Negative; fairly codominant vertebral arteries. Incidental late entry of the right vertebral into the cervical foramina, and conspicuous right vertebral thyrocervical branch at the C4-C5 level (normal variant). CTA HEAD Posterior circulation: Fairly codominant distal vertebral arteries with no plaque or stenosis. Patent PICA origins and vertebrobasilar junction. Patent basilar artery, SCA and PCA origins. Posterior communicating arteries are diminutive or absent. Bilateral PCA branches are within normal limits. Anterior circulation: Both ICA siphons are patent. No siphon plaque or stenosis. Patent carotid termini, MCA and ACA origins. The right A1 appears mildly dominant. Normal anterior communicating artery. Bilateral ACA branches are within normal limits. Left MCA M1 segment and trifurcation are patent without stenosis. Right MCA M1 segment and trifurcation are patent without stenosis.  Bilateral MCA branches are within normal limits. Venous sinuses: Patent. Anatomic variants: Mild variation of the cervical right vertebral artery as above. Review of the MIP images confirms the above findings IMPRESSION: 1. Normal CTA Head and Neck. 2.  Normal Head CT. Electronically Signed   By: Odessa Fleming M.D.   On: 02/03/2022 12:31    Procedures Procedures    Medications Ordered in ED Medications  sodium chloride 0.9 % bolus 1,000 mL (has no administration in time range)  metoCLOPramide (REGLAN) injection 10 mg (has no administration in time range)  diphenhydrAMINE (BENADRYL) injection 25 mg (has no administration in time range)  ketorolac (TORADOL) 30 MG/ML injection 30 mg (has no administration in time range)    ED Course/ Medical Decision Making/ A&P                             Medical Decision Making Amount and/or Complexity of Data Reviewed Labs: ordered. Decision-making details documented in ED Course. Radiology: ordered and independent interpretation performed. Decision-making details documented in ED Course. ECG/medicine tests: ordered and independent interpretation performed. Decision-making details documented in ED Course.  Risk Prescription drug management.   5 days of progressive worsening severe headache with nausea, photophobia and vomiting.  Denies thunderclap onset.  Denies blood thinner use.  Does take Tremfya which is an immunosuppressant.  Neurological exam is nonfocal.  He has no meningismus. History is concerning for migraine type headache but will evaluate with imaging given atypical nature of headache and no history of same. Low suspicion for subarachnoid hemorrhage.  CT head is negative for hemorrhage.  CTA angiogram is negative for aneurysm or high-grade stenosis.  Results reviewed interpreted by me.  Patient given IV fluids, Reglan, Benadryl and Toradol.  Also given Decadron.  Low suspicion for bacterial meningitis.  Patient appears well.  Normal rectal  temperature.  No leukocytosis.  He is immunosuppressed on Tremfya however. No meningismus, normal neurological exam  Risk and benefits of lumbar puncture discussed with patient. Discussed with patient that viral meningitis is still possible but the only way to evaluate for this is with spinal tap and lumbar puncture.  He declines this procedure at this time and appears to have capacity to make this decision.  He understands there is a nonzero risk of having viral meningitis but the treatment would be supportive like any other type of virus.  Low suspicion for nonaneurysmal cause of subarachnoid hemorrhage but this is theoretically possible as well and this would be evaluated with lumbar puncture.  He appears  well and has capacity to make this decision.  He has no pain with range of motion of his neck.  He has no significant fever or leukocytosis.  He has no neurological deficits.  His headache is improved in the ED with IV fluids, steroids, Toradol, Reglan and Benadryl.  Follow-up with PCP as well as neurology.  Return to the ED sooner with severe headache, confusion, fever, stiff neck, vomiting, unilateral weakness, numbness, tingling, any other concerns.       Final Clinical Impression(s) / ED Diagnoses Final diagnoses:  Bad headache    Rx / DC Orders ED Discharge Orders     None         Seville Downs, Annie Main, MD 02/03/22 1515

## 2022-02-03 NOTE — Discharge Instructions (Signed)
Please go to the emergency room for further evaluation and treatment of your headache

## 2022-02-03 NOTE — ED Provider Notes (Signed)
[facility]    CSN: [id number] Arrival date & time: 02/03/22  2595      History   Chief Complaint Chief Complaint  Patient presents with   Headache   Nausea    HPI Robert Hutchinson is a 42 y.o. male presents for headache.  Patient reports 6 days of a constant waxing and waning frontal headache that he currently rates as a 6 out of 10.  He does state Wednesday the headache was the worst headache of his life and it was a 10 out of 10.  It is associated with nausea, photosensitivity, dizziness, and bodyaches.  Denies any syncope, vomiting, visual changes, confusion, weakness.  Denies history of headaches or migraines.  No family history of migraines.  Denies any sinus congestion/pressure or URI symptoms.  No fevers.  He has been taking over-the-counter Advil that only temporarily helps but does not resolve the headache.  No other concerns at this time.   Headache Associated symptoms: dizziness, nausea and photophobia     Past Medical History:  Diagnosis Date   Heart murmur    functional   Psoriasis Dxd age 26   Sees derm in [place]: on enbrel 2010-2016, then switched to [facility]    Patient Active Problem List   Diagnosis Date Noted   Bilateral hand pain 01/27/2020   Medial epicondylitis of elbow, left 01/27/2020   Psoriasis 01/05/2011    Past Surgical History:  Procedure Laterality Date   APPENDECTOMY  1998       Home Medications    Prior to Admission medications   Medication Sig Start Date End Date Taking? Authorizing Provider  TREMFYA 100 MG/ML SOPN Inject into the skin. 01/10/22  Yes [provider]  celecoxib (CELEBREX) 100 MG capsule Take 1 capsule (100 mg total) by mouth 2 (two) times daily. 12/22/19   McGowen, Adrian Blackwater, MD  Multiple Vitamin (MULTIVITAMIN) tablet Take 1 tablet by mouth daily.    [provider]  STELARA 90 MG/ML SOSY injection every 8 (eight) weeks. 07/24/17   [provider]    Family History Family  History  Problem Relation Age of Onset   Other Father        Meningioma   Cancer Mother        kidney cancer about age 19   Cancer Maternal Grandmother        colon    Social History Social History   Tobacco Use   Smoking status: Never   Smokeless tobacco: Never  Vaping Use   Vaping Use: Never used  Substance Use Topics   Alcohol use: Yes    Comment: twice a week   Drug use: No     Allergies   Patient has no known allergies.   Review of Systems Review of Systems  Eyes:  Positive for photophobia.  Gastrointestinal:  Positive for nausea.  Neurological:  Positive for dizziness and headaches.     Physical Exam Triage Vital Signs ED Triage Vitals [02/03/22 0939]  Enc Vitals Group     BP (!) 148/97     Pulse Rate 82     Resp 16     Temp 97.9 F (36.6 C)     Temp Source Oral     SpO2 95 %     Weight      Height      Head Circumference      Peak Flow      Pain Score 7     Pain Loc  Pain Edu?      Excl. in [place]?    No data found.  Updated Vital Signs BP (!) 148/97 (BP Location: Right Arm)   Pulse 82   Temp 97.9 F (36.6 C) (Oral)   Resp 16   SpO2 95%   Visual Acuity Right Eye Distance:   Left Eye Distance:   Bilateral Distance:    Right Eye Near:   Left Eye Near:    Bilateral Near:     Physical Exam Vitals and nursing note reviewed.  Constitutional:      General: He is not in acute distress.    Appearance: Normal appearance. He is not ill-appearing.  HENT:     Head: Normocephalic and atraumatic.     Nose:     Right Sinus: No maxillary sinus tenderness or frontal sinus tenderness.     Left Sinus: No maxillary sinus tenderness or frontal sinus tenderness.  Eyes:     Extraocular Movements: Extraocular movements intact.     Pupils: Pupils are equal, round, and reactive to light.  Cardiovascular:     Rate and Rhythm: Normal rate.  Pulmonary:     Effort: Pulmonary effort is normal.  Skin:    General: Skin is warm and dry.   Neurological:     General: No focal deficit present.     Mental Status: He is alert and oriented to person, place, and time.     GCS: GCS eye subscore is 4. GCS verbal subscore is 5. GCS motor subscore is 6.     Cranial Nerves: No facial asymmetry.     Motor: No weakness.     Coordination: Romberg sign positive.     Gait: Gait is intact.  Psychiatric:        Mood and Affect: Mood normal.        Behavior: Behavior normal.      UC Treatments / Results  Labs (all labs ordered are listed, but only abnormal results are displayed) Labs Reviewed - No data to display  EKG   Radiology No results found.  Procedures Procedures (including critical care time)  Medications Ordered in UC Medications - No data to display  Initial Impression / Assessment and Plan / UC Course  I have reviewed the triage vital signs and the nursing notes.  Pertinent labs & imaging results that were available during my care of the patient were reviewed by me and considered in my medical decision making (see chart for details).     Reviewed exam and symptoms with patient.  Discussed limitations and abilities of urgent care Given new onset headache that has been persistent and has been described as the worst headache of his life I advised to go to the emergency room for further evaluation and treatment.  Patient is in agreement with plan will go POV to the emergency room.  He was instructed to pull over and call 911 for any worsening symptoms that occur in transit and he verbalized understanding. Final Clinical Impressions(s) / UC Diagnoses   Final diagnoses:  None   Discharge Instructions   None    ED Prescriptions   None    PDMP not reviewed this encounter.   Melynda Ripple, NP 02/03/22 [phone]

## 2022-02-03 NOTE — ED Triage Notes (Signed)
Patient arrives with complaints of persistent headache for 5 days.   Rates pain a 7/10. Sent here by Urgent Care for further evaluation.

## 2022-03-23 ENCOUNTER — Ambulatory Visit: Payer: 59 | Admitting: Psychiatry

## 2022-04-26 ENCOUNTER — Ambulatory Visit: Payer: 59 | Admitting: Psychiatry
